# Patient Record
Sex: Female | Born: 1980 | Race: Black or African American | Hispanic: No | Marital: Single | Smoking: Never smoker
Health system: Southern US, Community
[De-identification: ages and names within clinical notes are randomized; demographics above are authoritative.]

## PROBLEM LIST (undated history)

## (undated) DIAGNOSIS — A6000 Herpesviral infection of urogenital system, unspecified: Secondary | ICD-10-CM

## (undated) DIAGNOSIS — D649 Anemia, unspecified: Secondary | ICD-10-CM

## (undated) DIAGNOSIS — IMO0001 Reserved for inherently not codable concepts without codable children: Secondary | ICD-10-CM

## (undated) DIAGNOSIS — K219 Gastro-esophageal reflux disease without esophagitis: Secondary | ICD-10-CM

## (undated) DIAGNOSIS — I10 Essential (primary) hypertension: Secondary | ICD-10-CM

---

## 2005-04-23 ENCOUNTER — Emergency Department (HOSPITAL_COMMUNITY): Admission: EM | Admit: 2005-04-23 | Discharge: 2005-04-24 | Payer: Self-pay | Admitting: Emergency Medicine

## 2005-05-04 ENCOUNTER — Emergency Department (HOSPITAL_COMMUNITY): Admission: EM | Admit: 2005-05-04 | Discharge: 2005-05-05 | Payer: Self-pay | Admitting: Emergency Medicine

## 2005-05-04 ENCOUNTER — Encounter: Admission: RE | Admit: 2005-05-04 | Discharge: 2005-08-02 | Payer: Self-pay | Admitting: Orthopedic Surgery

## 2006-01-24 ENCOUNTER — Emergency Department (HOSPITAL_COMMUNITY): Admission: EM | Admit: 2006-01-24 | Discharge: 2006-01-24 | Payer: Self-pay | Admitting: Family Medicine

## 2009-10-09 ENCOUNTER — Emergency Department (HOSPITAL_COMMUNITY): Admission: EM | Admit: 2009-10-09 | Discharge: 2009-10-09 | Payer: Self-pay | Admitting: Emergency Medicine

## 2010-10-24 LAB — CBC
HCT: 41 % (ref 36.0–46.0)
MCV: 92.7 fL (ref 78.0–100.0)
Platelets: 295 10*3/uL (ref 150–400)
RDW: 13.6 % (ref 11.5–15.5)

## 2011-02-13 ENCOUNTER — Emergency Department (HOSPITAL_COMMUNITY)
Admission: EM | Admit: 2011-02-13 | Discharge: 2011-02-13 | Disposition: A | Discharge status: Home / Self Care | Payer: Medicaid Other | Attending: Emergency Medicine | Admitting: Emergency Medicine

## 2011-02-13 DIAGNOSIS — Z3201 Encounter for pregnancy test, result positive: Secondary | ICD-10-CM | POA: Insufficient documentation

## 2011-02-13 DIAGNOSIS — R109 Unspecified abdominal pain: Secondary | ICD-10-CM | POA: Insufficient documentation

## 2011-02-13 LAB — CBC
MCH: 31.4 pg (ref 26.0–34.0)
MCHC: 35.5 g/dL (ref 30.0–36.0)
Platelets: 322 10*3/uL (ref 150–400)

## 2011-02-13 LAB — WET PREP, GENITAL
Trich, Wet Prep: NONE SEEN
Yeast Wet Prep HPF POC: NONE SEEN

## 2011-02-13 LAB — URINALYSIS, ROUTINE W REFLEX MICROSCOPIC
Bilirubin Urine: NEGATIVE
Glucose, UA: NEGATIVE mg/dL
Ketones, ur: NEGATIVE mg/dL
Leukocytes, UA: NEGATIVE
pH: 8 (ref 5.0–8.0)

## 2011-02-13 LAB — BASIC METABOLIC PANEL
Calcium: 9 mg/dL (ref 8.4–10.5)
GFR calc non Af Amer: >60 mL/min (ref >60)
Sodium: 136 mEq/L (ref 135–145)

## 2013-10-21 DIAGNOSIS — N390 Urinary tract infection, site not specified: Secondary | ICD-10-CM | POA: Insufficient documentation

## 2013-10-21 DIAGNOSIS — Z8619 Personal history of other infectious and parasitic diseases: Secondary | ICD-10-CM | POA: Insufficient documentation

## 2013-10-21 DIAGNOSIS — Z3202 Encounter for pregnancy test, result negative: Secondary | ICD-10-CM | POA: Insufficient documentation

## 2013-10-21 NOTE — ED Notes (Signed)
Low abd pain started today and vaginal irritation started 2 days ago.  Would like to be checked for STDs.

## 2013-10-22 ENCOUNTER — Emergency Department (HOSPITAL_BASED_OUTPATIENT_CLINIC_OR_DEPARTMENT_OTHER)
Admission: EM | Admit: 2013-10-22 | Discharge: 2013-10-22 | Disposition: A | Discharge status: Home / Self Care | Payer: Medicaid Other | Attending: Emergency Medicine | Admitting: Emergency Medicine

## 2013-10-22 ENCOUNTER — Encounter (HOSPITAL_BASED_OUTPATIENT_CLINIC_OR_DEPARTMENT_OTHER): Payer: Self-pay | Admitting: Emergency Medicine

## 2013-10-22 DIAGNOSIS — N390 Urinary tract infection, site not specified: Secondary | ICD-10-CM

## 2013-10-22 HISTORY — DX: Herpesviral infection of urogenital system, unspecified: A60.00

## 2013-10-22 LAB — URINALYSIS, ROUTINE W REFLEX MICROSCOPIC
BILIRUBIN URINE: NEGATIVE
Glucose, UA: NEGATIVE mg/dL
KETONES UR: NEGATIVE mg/dL
Nitrite: NEGATIVE
PH: 7.5 (ref 5.0–8.0)
Protein, ur: 30 mg/dL — AB
SPECIFIC GRAVITY, URINE: 1.025 (ref 1.005–1.030)
UROBILINOGEN UA: 1 mg/dL (ref 0.0–1.0)

## 2013-10-22 LAB — WET PREP, GENITAL
TRICH WET PREP: NONE SEEN
YEAST WET PREP: NONE SEEN

## 2013-10-22 LAB — URINE MICROSCOPIC-ADD ON

## 2013-10-22 LAB — PREGNANCY, URINE: Preg Test, Ur: NEGATIVE

## 2013-10-22 LAB — GC/CHLAMYDIA PROBE AMP
CT Probe RNA: NEGATIVE
GC Probe RNA: NEGATIVE

## 2013-10-22 MED ORDER — NITROFURANTOIN MONOHYD MACRO 100 MG PO CAPS: 100.0000 mg | ORAL_CAPSULE | Freq: Two times a day (BID) | ORAL | Status: DC

## 2013-10-22 MED ORDER — PHENAZOPYRIDINE HCL 200 MG PO TABS: 200.0000 mg | ORAL_TABLET | Freq: Three times a day (TID) | ORAL | Status: DC | PRN

## 2013-10-22 NOTE — ED Provider Notes (Signed)
CSN: [id number]     Arrival date & time 10/21/13  2354 History   This chart was scribed for Kennis Buell Smitty CordsK Ormond Lazo-Rasch, MD by Ladona Ridgelaylor Day, ED scribe. This patient was seen in room MH05/MH05 and the patient's care was started at 2354.  Chief Complaint  Patient presents with  . Abdominal Pain   Patient is a 33 y.o. female presenting with vaginal itching. The history is provided by the patient. No language interpreter was used.  Vaginal Itching This is a new problem. The current episode started 2 days ago. The problem occurs constantly. The problem has not changed since onset.Pertinent negatives include no chest pain, no abdominal pain, no headaches and no shortness of breath. Nothing aggravates the symptoms. Nothing relieves the symptoms. She has tried nothing for the symptoms. The treatment provided no relief.   HPI Comments: Latasha Mills is a 33 y.o. female who presents to the Emergency Department to be checked for STD because she states has had x1 day of vaginal irritation. She reports tried an at home treatment for this problem w/no relief.   LNMP week ago Past Medical History  Diagnosis Date  . Herpes genitalia    Past Surgical History  Procedure Laterality Date  . Cesarean section     No family history on file. History  Substance Use Topics  . Smoking status: Never Smoker   . Smokeless tobacco: Not on file  . Alcohol Use: No   OB History   Grav Para Term Preterm Abortions TAB SAB Ect Mult Living                 Review of Systems  Respiratory: Negative for shortness of breath.   Cardiovascular: Negative for chest pain.  Gastrointestinal: Negative for abdominal pain.  Genitourinary: Negative for genital sores.  Musculoskeletal: Negative for back pain.  Skin: Negative for rash.  Neurological: Negative for headaches.  All other systems reviewed and are negative.    Allergies  Other  Home Medications  No current outpatient prescriptions on file.  Triage Vitals: BP  113/59  Pulse 86  Temp(Src) 98.2 F (36.8 C) (Oral)  Resp 16  Ht 5\' 1"  (1.549 m)  Wt 157 lb (71.215 kg)  BMI 29.68 kg/m2  SpO2 97%  LMP 10/12/2013 Physical Exam  Nursing note and vitals reviewed. Constitutional: She is oriented to person, place, and time. She appears well-developed and well-nourished. No distress.  HENT:  Head: Normocephalic and atraumatic.  Mouth/Throat: Oropharynx is clear and moist.  Eyes: Conjunctivae are normal. Right eye exhibits no discharge. Left eye exhibits no discharge.  Neck: Normal range of motion.  Cardiovascular: Normal rate, regular rhythm and normal heart sounds.   Pulmonary/Chest: Effort normal and breath sounds normal. No respiratory distress. She has no wheezes. She has no rales.  Abdominal: Soft. Bowel sounds are normal. There is no tenderness. There is no rebound and no guarding.  Genitourinary: No vaginal discharge found.  Chaperone present no lesions  Musculoskeletal: Normal range of motion. She exhibits no edema.  Neurological: She is alert and oriented to person, place, and time.  Skin: Skin is warm and dry.  Psychiatric: She has a normal mood and affect. Thought content normal.    ED Course  Procedures (including critical care time) DIAGNOSTIC STUDIES: Oxygen Saturation is 97% on room air, normal by my interpretation.    COORDINATION OF CARE:   Labs Review Labs Reviewed  PREGNANCY, URINE  URINALYSIS, ROUTINE W REFLEX MICROSCOPIC   Imaging Review No results found.  EKG Interpretation None      MDM   Final diagnoses:  None    Use only ivory soap and no chemicals in the bath.  Will treat for UTI   I personally performed the services described in this documentation, which was scribed in my presence. The recorded information has been reviewed and is accurate.      Jasmine Awe, MD 10/22/13 [phone]

## 2014-03-02 ENCOUNTER — Encounter (HOSPITAL_BASED_OUTPATIENT_CLINIC_OR_DEPARTMENT_OTHER): Payer: Self-pay | Admitting: Emergency Medicine

## 2014-03-02 ENCOUNTER — Emergency Department (HOSPITAL_BASED_OUTPATIENT_CLINIC_OR_DEPARTMENT_OTHER)
Admission: EM | Admit: 2014-03-02 | Discharge: 2014-03-02 | Disposition: A | Discharge status: Home / Self Care | Payer: Medicaid Other | Attending: Emergency Medicine | Admitting: Emergency Medicine

## 2014-03-02 DIAGNOSIS — Z8719 Personal history of other diseases of the digestive system: Secondary | ICD-10-CM | POA: Insufficient documentation

## 2014-03-02 DIAGNOSIS — Z79899 Other long term (current) drug therapy: Secondary | ICD-10-CM | POA: Diagnosis not present

## 2014-03-02 DIAGNOSIS — Z8619 Personal history of other infectious and parasitic diseases: Secondary | ICD-10-CM | POA: Diagnosis not present

## 2014-03-02 DIAGNOSIS — Z113 Encounter for screening for infections with a predominantly sexual mode of transmission: Secondary | ICD-10-CM | POA: Diagnosis present

## 2014-03-02 DIAGNOSIS — Z202 Contact with and (suspected) exposure to infections with a predominantly sexual mode of transmission: Secondary | ICD-10-CM

## 2014-03-02 DIAGNOSIS — Z862 Personal history of diseases of the blood and blood-forming organs and certain disorders involving the immune mechanism: Secondary | ICD-10-CM | POA: Diagnosis not present

## 2014-03-02 DIAGNOSIS — Z3202 Encounter for pregnancy test, result negative: Secondary | ICD-10-CM | POA: Diagnosis not present

## 2014-03-02 HISTORY — DX: Anemia, unspecified: D64.9

## 2014-03-02 HISTORY — DX: Reserved for inherently not codable concepts without codable children: IMO0001

## 2014-03-02 HISTORY — DX: Gastro-esophageal reflux disease without esophagitis: K21.9

## 2014-03-02 LAB — URINALYSIS, ROUTINE W REFLEX MICROSCOPIC
Bilirubin Urine: NEGATIVE
GLUCOSE, UA: NEGATIVE mg/dL
Ketones, ur: NEGATIVE mg/dL
LEUKOCYTES UA: NEGATIVE
Nitrite: NEGATIVE
PH: 6 (ref 5.0–8.0)
Protein, ur: NEGATIVE mg/dL
SPECIFIC GRAVITY, URINE: 1.023 (ref 1.005–1.030)
Urobilinogen, UA: 2 mg/dL — ABNORMAL HIGH (ref 0.0–1.0)

## 2014-03-02 LAB — WET PREP, GENITAL
TRICH WET PREP: NONE SEEN
WBC WET PREP: NONE SEEN
Yeast Wet Prep HPF POC: NONE SEEN

## 2014-03-02 LAB — PREGNANCY, URINE: Preg Test, Ur: NEGATIVE

## 2014-03-02 LAB — URINE MICROSCOPIC-ADD ON

## 2014-03-02 MED ORDER — AZITHROMYCIN 250 MG PO TABS
1000.0000 mg | ORAL_TABLET | Freq: Once | ORAL | Status: AC
  Administered 2014-03-02: 1000 mg via ORAL
  Filled 2014-03-02: qty 4

## 2014-03-02 NOTE — ED Notes (Signed)
MD with pt  

## 2014-03-02 NOTE — ED Notes (Signed)
C/o vaginal pain that started today. Denies any increase in vaginal d/c. Denies any fevers. States her partner was treated for Chlamydia and request an STD check.  Denies any urinary symptoms.

## 2014-03-02 NOTE — ED Provider Notes (Signed)
CSN: [id number]     Arrival date & time 03/02/14  0421 History   First MD Initiated Contact with Patient 03/02/14 [phone]     Chief Complaint  Patient presents with  . STD check      (Consider location/radiation/quality/duration/timing/severity/associated sxs/prior Treatment) Patient is a 33 y.o. female presenting with STD exposure. The history is provided by the patient.  Exposure to STD This is a new problem. The current episode started 12 to 24 hours ago. The problem occurs constantly. The problem has not changed since onset.Pertinent negatives include no chest pain, no abdominal pain, no headaches and no shortness of breath. Nothing aggravates the symptoms. Nothing relieves the symptoms. She has tried nothing for the symptoms. The treatment provided no relief.  Partner was treated for chlamydia according to a friend and not the partner himself and she had sex with him following treatment with a condom and wants to be checked  Past Medical History  Diagnosis Date  . Herpes genitalia   . Reflux   . Anemia    Past Surgical History  Procedure Laterality Date  . Cesarean section     History reviewed. No pertinent family history. History  Substance Use Topics  . Smoking status: Never Smoker   . Smokeless tobacco: Not on file  . Alcohol Use: No   OB History   Grav Para Term Preterm Abortions TAB SAB Ect Mult Living                 Review of Systems  Respiratory: Negative for shortness of breath.   Cardiovascular: Negative for chest pain.  Gastrointestinal: Negative for abdominal pain.  Neurological: Negative for headaches.  All other systems reviewed and are negative.     Allergies  Other  Home Medications   Prior to Admission medications   Medication Sig Start Date End Date Taking? Authorizing Provider  nitrofurantoin, macrocrystal-monohydrate, (MACROBID) 100 MG capsule Take 1 capsule (100 mg total) by mouth 2 (two) times daily. X 7 days 10/22/13   Cheresa Siers K  Wyona Neils-Rasch, MD  phenazopyridine (PYRIDIUM) 200 MG tablet Take 1 tablet (200 mg total) by mouth 3 (three) times daily as needed for pain. 10/22/13   Lalania Haseman K Ainslie Mazurek-Rasch, MD   BP 132/70  Pulse 79  Temp(Src) 98.7 F (37.1 C) (Oral)  Resp 18  SpO2 100%  LMP 01/30/2014 Physical Exam  Constitutional: She is oriented to person, place, and time. She appears well-developed and well-nourished. No distress.  HENT:  Head: Normocephalic and atraumatic.  Mouth/Throat: Oropharynx is clear and moist.  Eyes: Conjunctivae are normal. Pupils are equal, round, and reactive to light.  Neck: Normal range of motion. Neck supple.  Cardiovascular: Normal rate, regular rhythm and intact distal pulses.   Pulmonary/Chest: Effort normal and breath sounds normal. She has no wheezes. She has no rales.  Abdominal: Soft. Bowel sounds are normal. There is no tenderness. There is no rebound and no guarding.  Genitourinary: No vaginal discharge found.  No cmt no adnexal tenderness.  No discharge Chaperone present  Musculoskeletal: Normal range of motion.  Neurological: She is oriented to person, place, and time.  Skin: Skin is warm and dry.  Psychiatric: She has a normal mood and affect.    ED Course  Procedures (including critical care time) Labs Review Labs Reviewed  WET PREP, GENITAL - Abnormal; Notable for the following:    Clue Cells Wet Prep HPF POC FEW (*)    All other components within normal limits  URINALYSIS, ROUTINE W  REFLEX MICROSCOPIC - Abnormal; Notable for the following:    Hgb urine dipstick TRACE (*)    Urobilinogen, UA 2.0 (*)    All other components within normal limits  URINE MICROSCOPIC-ADD ON - Abnormal; Notable for the following:    Squamous Epithelial / LPF FEW (*)    Bacteria, UA FEW (*)    All other components within normal limits  GC/CHLAMYDIA PROBE AMP  PREGNANCY, URINE    Imaging Review No results found.   EKG Interpretation None      MDM   Final diagnoses:   None    Will treat for chlamydia.  [facility].  No sexual activity of any kind until 7 days after all partners treated   Trenda Corliss Smitty Cords, MD 03/02/14 [id number]

## 2014-03-03 LAB — GC/CHLAMYDIA PROBE AMP
CT PROBE, AMP APTIMA: NEGATIVE
GC Probe RNA: NEGATIVE

## 2015-03-14 ENCOUNTER — Encounter (HOSPITAL_BASED_OUTPATIENT_CLINIC_OR_DEPARTMENT_OTHER): Payer: Self-pay | Admitting: *Deleted

## 2015-03-14 ENCOUNTER — Emergency Department (HOSPITAL_BASED_OUTPATIENT_CLINIC_OR_DEPARTMENT_OTHER)
Admission: EM | Admit: 2015-03-14 | Discharge: 2015-03-14 | Disposition: A | Discharge status: Home / Self Care | Payer: Medicaid Other | Attending: Emergency Medicine | Admitting: Emergency Medicine

## 2015-03-14 DIAGNOSIS — Z3202 Encounter for pregnancy test, result negative: Secondary | ICD-10-CM | POA: Diagnosis not present

## 2015-03-14 DIAGNOSIS — Z862 Personal history of diseases of the blood and blood-forming organs and certain disorders involving the immune mechanism: Secondary | ICD-10-CM | POA: Insufficient documentation

## 2015-03-14 DIAGNOSIS — N898 Other specified noninflammatory disorders of vagina: Secondary | ICD-10-CM | POA: Diagnosis present

## 2015-03-14 DIAGNOSIS — Z8619 Personal history of other infectious and parasitic diseases: Secondary | ICD-10-CM | POA: Diagnosis not present

## 2015-03-14 DIAGNOSIS — N760 Acute vaginitis: Secondary | ICD-10-CM | POA: Diagnosis not present

## 2015-03-14 DIAGNOSIS — Z8719 Personal history of other diseases of the digestive system: Secondary | ICD-10-CM | POA: Diagnosis not present

## 2015-03-14 DIAGNOSIS — B9689 Other specified bacterial agents as the cause of diseases classified elsewhere: Secondary | ICD-10-CM

## 2015-03-14 LAB — URINALYSIS, ROUTINE W REFLEX MICROSCOPIC
Bilirubin Urine: NEGATIVE
Glucose, UA: NEGATIVE mg/dL
HGB URINE DIPSTICK: NEGATIVE
Ketones, ur: 15 mg/dL — AB
Leukocytes, UA: NEGATIVE
Nitrite: NEGATIVE
Protein, ur: 100 mg/dL — AB
SPECIFIC GRAVITY, URINE: 1.031 — AB (ref 1.005–1.030)
UROBILINOGEN UA: 1 mg/dL (ref 0.0–1.0)
pH: 6.5 (ref 5.0–8.0)

## 2015-03-14 LAB — URINE MICROSCOPIC-ADD ON

## 2015-03-14 LAB — WET PREP, GENITAL
Trich, Wet Prep: NONE SEEN
Yeast Wet Prep HPF POC: NONE SEEN

## 2015-03-14 LAB — PREGNANCY, URINE: PREG TEST UR: NEGATIVE

## 2015-03-14 MED ORDER — LIDOCAINE HCL (PF) 1 % IJ SOLN
INTRAMUSCULAR | Status: AC
  Administered 2015-03-14: 5 mL
  Filled 2015-03-14: qty 5

## 2015-03-14 MED ORDER — METRONIDAZOLE 500 MG PO TABS: 500.0000 mg | ORAL_TABLET | Freq: Two times a day (BID) | ORAL | Status: DC

## 2015-03-14 MED ORDER — AZITHROMYCIN 250 MG PO TABS
1000.0000 mg | ORAL_TABLET | Freq: Once | ORAL | Status: AC
  Administered 2015-03-14: 1000 mg via ORAL
  Filled 2015-03-14: qty 4

## 2015-03-14 MED ORDER — CEFTRIAXONE SODIUM 250 MG IJ SOLR
250.0000 mg | Freq: Once | INTRAMUSCULAR | Status: AC
  Administered 2015-03-14: 250 mg via INTRAMUSCULAR
  Filled 2015-03-14: qty 250

## 2015-03-14 NOTE — ED Provider Notes (Signed)
CSN: [id number]     Arrival date & time 03/14/15  1824 History   First MD Initiated Contact with Patient 03/14/15 1856     Chief Complaint  Patient presents with  . Vaginal Discharge     (Consider location/radiation/quality/duration/timing/severity/associated sxs/prior Treatment) HPI Comments: 34 year old female complaining of vaginal discharge 6 days, yellow in appearance. No vaginal pain, itching, abdominal pain, nausea, vomiting, fever, chills, urinary symptoms. No new sexual partners. Last had intercourse with her child's father one month ago and does not use protection. He is sleeping with another female and she is concerned about STDs. No aggravating or alleviating factors.  Patient is a 34 y.o. female presenting with vaginal discharge. The history is provided by the patient.  Vaginal Discharge   Past Medical History  Diagnosis Date  . Herpes genitalia   . Reflux   . Anemia    Past Surgical History  Procedure Laterality Date  . Cesarean section     No family history on file. Social History  Substance Use Topics  . Smoking status: Never Smoker   . Smokeless tobacco: Never Used  . Alcohol Use: Yes     Comment: occasional   OB History    No data available     Review of Systems  Genitourinary: Positive for vaginal discharge.  All other systems reviewed and are negative.     Allergies  Other  Home Medications   Prior to Admission medications   Medication Sig Start Date End Date Taking? Authorizing Provider  metroNIDAZOLE (FLAGYL) 500 MG tablet Take 1 tablet (500 mg total) by mouth 2 (two) times daily. One po bid x 7 days 03/14/15   Kathrynn Speed, PA-C  nitrofurantoin, macrocrystal-monohydrate, (MACROBID) 100 MG capsule Take 1 capsule (100 mg total) by mouth 2 (two) times daily. X 7 days 10/22/13   April Palumbo, MD  phenazopyridine (PYRIDIUM) 200 MG tablet Take 1 tablet (200 mg total) by mouth 3 (three) times daily as needed for pain. 10/22/13   April Palumbo, MD    BP 139/79 mmHg  Pulse 85  Temp(Src) 99.1 F (37.3 C) (Oral)  Resp 18  Ht  (1.575 m)  Wt 175 lb (79.379 kg)  BMI 32.00 kg/m2  SpO2 100%  LMP 02/25/2015 Physical Exam  Constitutional: She is oriented to person, place, and time. She appears well-developed and well-nourished. No distress.  HENT:  Head: Normocephalic and atraumatic.  Mouth/Throat: Oropharynx is clear and moist.  Eyes: Conjunctivae and EOM are normal.  Neck: Normal range of motion. Neck supple.  Cardiovascular: Normal rate, regular rhythm and normal heart sounds.   Pulmonary/Chest: Effort normal and breath sounds normal. No respiratory distress.  Abdominal: Soft. Bowel sounds are normal. She exhibits no distension. There is no tenderness.  Genitourinary: Uterus normal. Cervix exhibits no motion tenderness and no discharge. Right adnexum displays no tenderness. Left adnexum displays no tenderness. No erythema, tenderness or bleeding in the vagina. Vaginal discharge found.  Musculoskeletal: Normal range of motion. She exhibits no edema.  Neurological: She is alert and oriented to person, place, and time. No sensory deficit.  Skin: Skin is warm and dry.  Psychiatric: She has a normal mood and affect. Her behavior is normal.  Nursing note and vitals reviewed.   ED Course  Procedures (including critical care time) Labs Review Labs Reviewed  WET PREP, GENITAL - Abnormal; Notable for the following:    Clue Cells Wet Prep HPF POC MODERATE (*)    WBC, Wet Prep HPF POC MODERATE (*)  All other components within normal limits  URINALYSIS, ROUTINE W REFLEX MICROSCOPIC (NOT AT [facility]) - Abnormal; Notable for the following:    Specific Gravity, Urine 1.031 (*)    Ketones, ur 15 (*)    Protein, ur 100 (*)    All other components within normal limits  URINE MICROSCOPIC-ADD ON - Abnormal; Notable for the following:    Squamous Epithelial / LPF FEW (*)    Bacteria, UA MANY (*)    All other components within normal limits   PREGNANCY, URINE  GC/CHLAMYDIA PROBE AMP ([place]) NOT AT [facility]    Imaging Review No results found. I, Celene Skeen, personally reviewed and evaluated these images and lab results as part of my medical decision-making.   EKG Interpretation None      MDM   Final diagnoses:  BV (bacterial vaginosis)   Nontoxic appearing, NAD. AF VSS. Abdomen soft and nontender. No CMT or adnexal tenderness concerning for PID, ovarian torsion. No cervical discharge. Patient concerned of STDs and requesting treatment, Rocephin and azithromycin given. Will treat BV with Flagyl. Infection care/precautions discussed. Safe sexual practices discussed. Stable for discharge. Follow-up with PCP. Return precautions given. Patient states understanding of treatment care plan and is agreeable.  Kathrynn Speed, PA-C 03/14/15 2006  Blake Divine, MD 03/16/15 [phone]

## 2015-03-14 NOTE — ED Notes (Signed)
Pt reports vaginal irritation and discharge since Sunday- last sex 1 month ago, no condom used

## 2015-03-14 NOTE — Discharge Instructions (Signed)
Take Flagyl twice daily for 1 week. No sexual intercourse for 10 days after completing antibiotics. You were treated today for both gonorrhea and chlamydia. If these tests result positive, you will be contacted and are then obligated to inform your partner for treatment.  Bacterial Vaginosis Bacterial vaginosis is a vaginal infection that occurs when the normal balance of bacteria in the vagina is disrupted. It results from an overgrowth of certain bacteria. This is the most common vaginal infection in women of childbearing age. Treatment is important to prevent complications, especially in pregnant women, as it can cause a premature delivery. CAUSES  Bacterial vaginosis is caused by an increase in harmful bacteria that are normally present in smaller amounts in the vagina. Several different kinds of bacteria can cause bacterial vaginosis. However, the reason that the condition develops is not fully understood. RISK FACTORS Certain activities or behaviors can put you at an increased risk of developing bacterial vaginosis, including:  Having a new sex partner or multiple sex partners.  Douching.  Using an intrauterine device (IUD) for contraception. Women do not get bacterial vaginosis from toilet seats, bedding, swimming pools, or contact with objects around them. SIGNS AND SYMPTOMS  Some women with bacterial vaginosis have no signs or symptoms. Common symptoms include:  Grey vaginal discharge.  A fishlike odor with discharge, especially after sexual intercourse.  Itching or burning of the vagina and vulva.  Burning or pain with urination. DIAGNOSIS  Your health care provider will take a medical history and examine the vagina for signs of bacterial vaginosis. A sample of vaginal fluid may be taken. Your health care provider will look at this sample under a microscope to check for bacteria and abnormal cells. A vaginal pH test may also be done.  TREATMENT  Bacterial vaginosis may be  treated with antibiotic medicines. These may be given in the form of a pill or a vaginal cream. A second round of antibiotics may be prescribed if the condition comes back after treatment.  HOME CARE INSTRUCTIONS   Only take over-the-counter or prescription medicines as directed by your health care provider.  If antibiotic medicine was prescribed, take it as directed. Make sure you finish it even if you start to feel better.  Do not have sex until treatment is completed.  Tell all sexual partners that you have a vaginal infection. They should see their health care provider and be treated if they have problems, such as a mild rash or itching.  Practice safe sex by using condoms and only having one sex partner. SEEK MEDICAL CARE IF:   Your symptoms are not improving after 3 days of treatment.  You have increased discharge or pain.  You have a fever. MAKE SURE YOU:   Understand these instructions.  Will watch your condition.  Will get help right away if you are not doing well or get worse. FOR MORE INFORMATION  Centers for Disease Control and Prevention, Division of STD Prevention: [employer] American Sexual Health Association (ASHA): www.ashastd.org  Document Released: 07/18/2005 Document Revised: 05/08/2013 Document Reviewed: 02/27/2013 [facility] Patient Information 2015 [place], [place]. This information is not intended to replace advice given to you by your health care provider. Make sure you discuss any questions you have with your health care provider.

## 2015-03-16 LAB — GC/CHLAMYDIA PROBE AMP (~~LOC~~) NOT AT ARMC
CHLAMYDIA, DNA PROBE: NEGATIVE
NEISSERIA GONORRHEA: NEGATIVE

## 2015-05-31 ENCOUNTER — Emergency Department (HOSPITAL_BASED_OUTPATIENT_CLINIC_OR_DEPARTMENT_OTHER): Payer: Medicaid Other

## 2015-05-31 ENCOUNTER — Encounter (HOSPITAL_BASED_OUTPATIENT_CLINIC_OR_DEPARTMENT_OTHER): Payer: Self-pay | Admitting: *Deleted

## 2015-05-31 ENCOUNTER — Emergency Department (HOSPITAL_BASED_OUTPATIENT_CLINIC_OR_DEPARTMENT_OTHER)
Admission: EM | Admit: 2015-05-31 | Discharge: 2015-05-31 | Disposition: A | Discharge status: Home / Self Care | Payer: Medicaid Other | Attending: Emergency Medicine | Admitting: Emergency Medicine

## 2015-05-31 DIAGNOSIS — Z8619 Personal history of other infectious and parasitic diseases: Secondary | ICD-10-CM | POA: Diagnosis not present

## 2015-05-31 DIAGNOSIS — R0989 Other specified symptoms and signs involving the circulatory and respiratory systems: Secondary | ICD-10-CM | POA: Insufficient documentation

## 2015-05-31 DIAGNOSIS — Z8719 Personal history of other diseases of the digestive system: Secondary | ICD-10-CM | POA: Diagnosis not present

## 2015-05-31 DIAGNOSIS — Z862 Personal history of diseases of the blood and blood-forming organs and certain disorders involving the immune mechanism: Secondary | ICD-10-CM | POA: Insufficient documentation

## 2015-05-31 DIAGNOSIS — K228 Other specified diseases of esophagus: Secondary | ICD-10-CM

## 2015-05-31 DIAGNOSIS — R198 Other specified symptoms and signs involving the digestive system and abdomen: Secondary | ICD-10-CM

## 2015-05-31 MED ORDER — GI COCKTAIL ~~LOC~~
30.0000 mL | Freq: Once | ORAL | Status: AC
  Administered 2015-05-31: 30 mL via ORAL
  Filled 2015-05-31: qty 30

## 2015-05-31 MED ORDER — IOHEXOL 300 MG/ML  SOLN
75.0000 mL | Freq: Once | INTRAMUSCULAR | Status: AC | PRN
Start: 2015-05-31 — End: 2015-05-31
  Administered 2015-05-31: 75 mL via INTRAVENOUS

## 2015-05-31 NOTE — ED Notes (Signed)
Swallowed clear plastic jewelry piece early Saturday morning which is irritating throat, feels stuck, reports intermitant gag and cough, vomited x1. No obvious dyspnea. Able to keep down and tolerate liquids.

## 2015-05-31 NOTE — ED Notes (Signed)
Back from xray, no changes, calm, NAD.

## 2015-05-31 NOTE — ED Notes (Signed)
Dr. Judd Lienelo into see pt at time of d/c, results and plan explained.

## 2015-05-31 NOTE — ED Provider Notes (Signed)
CSN: [id number]     Arrival date & time 05/31/15  0248 History   First MD Initiated Contact with Patient 05/31/15 0304     Chief Complaint  Patient presents with  . Foreign Body     (Consider location/radiation/quality/duration/timing/severity/associated sxs/prior Treatment) HPI Comments: Patient is a 34 year old female who presents with complaints of throat pain. She states that she had a piece of clear plastic jewelry in her mouth yesterday morning but she inadvertently swallowed. She states that her throat is irritated and she feels as though something is stuck. She denies any difficulty breathing. She reports occasional gagging.  Patient is a 34 y.o. female presenting with foreign body. The history is provided by the patient.  Foreign Body Intake: Throat. Suspected object: Plastic. Pain quality:  Sharp Pain severity:  Moderate Duration:  24 hours Timing:  Constant Progression:  Worsening Chronicity:  New Worsened by:  Nothing tried Ineffective treatments:  None tried   Past Medical History  Diagnosis Date  . Herpes genitalia   . Reflux   . Anemia    Past Surgical History  Procedure Laterality Date  . Cesarean section     History reviewed. No pertinent family history. Social History  Substance Use Topics  . Smoking status: Never Smoker   . Smokeless tobacco: Never Used  . Alcohol Use: Yes     Comment: occasional   OB History    No data available     Review of Systems  All other systems reviewed and are negative.     Allergies  Other  Home Medications   Prior to Admission medications   Medication Sig Start Date End Date Taking? Authorizing Provider  metroNIDAZOLE (FLAGYL) 500 MG tablet Take 1 tablet (500 mg total) by mouth 2 (two) times daily. One po bid x 7 days 03/14/15   Kathrynn Speed, PA-C  nitrofurantoin, macrocrystal-monohydrate, (MACROBID) 100 MG capsule Take 1 capsule (100 mg total) by mouth 2 (two) times daily. X 7 days 10/22/13   April Palumbo,  MD  phenazopyridine (PYRIDIUM) 200 MG tablet Take 1 tablet (200 mg total) by mouth 3 (three) times daily as needed for pain. 10/22/13   April Palumbo, MD   BP 154/100 mmHg  Pulse 73  Temp(Src) 98.2 F (36.8 C) (Oral)  Resp 16  Ht  (1.575 m)  Wt 186 lb (84.369 kg)  BMI 34.01 kg/m2  SpO2 99%  LMP 05/21/2015 (Approximate) Physical Exam  Constitutional: She is oriented to person, place, and time. She appears well-developed and well-nourished. No distress.  HENT:  Head: Normocephalic and atraumatic.  Mouth/Throat: Oropharynx is clear and moist.  Neck: Normal range of motion. Neck supple.  Cardiovascular: Normal rate, regular rhythm and normal heart sounds.   No murmur heard. Pulmonary/Chest: Effort normal and breath sounds normal. No respiratory distress. She has no wheezes.  Abdominal: Soft. Bowel sounds are normal.  Neurological: She is alert and oriented to person, place, and time.  Skin: Skin is warm and dry. She is not diaphoretic.  Nursing note and vitals reviewed.   ED Course  Procedures (including critical care time) Labs Review Labs Reviewed - No data to display  Imaging Review No results found. I have personally reviewed and evaluated these images and lab results as part of my medical decision-making.   EKG Interpretation None      MDM   Final diagnoses:  None    There are no radiopaque foreign bodies on x-ray or CT scan. I suspect she has scratched  her throat and this is the discomfort that she is feeling. I will advise her to give this time. If she is not improving in the next 1-2 days, she is to go to [facility] for further evaluation and possible endoscopy.    Geoffery Lyonsouglas Shyanne Mcclary, MD 05/31/15 [phone]

## 2015-05-31 NOTE — Discharge Instructions (Signed)
Go to [facility] if you're not improving in the next 1-2 days.   Esophagogastroduodenoscopy Esophagogastroduodenoscopy (EGD) is a procedure that is used to examine the lining of the esophagus, stomach, and first part of the small intestine (duodenum). A long, flexible, lighted tube with a camera attached (endoscope) is inserted down the throat to view these organs. This procedure is done to detect problems or abnormalities, such as inflammation, bleeding, ulcers, or growths, in order to treat them. The procedure lasts 5-20 minutes. It is usually an outpatient procedure, but it may need to be performed in a hospital in emergency cases. LET [facility] CARE PROVIDER KNOW ABOUT:  Any allergies you have.  All medicines you are taking, including vitamins, herbs, eye drops, creams, and over-the-counter medicines.  Previous problems you or members of your family have had with the use of anesthetics.  Any blood disorders you have.  Previous surgeries you have had.  Medical conditions you have. RISKS AND COMPLICATIONS Generally, this is a safe procedure. However, problems can occur and include:  Infection.  Bleeding.  Tearing (perforation) of the esophagus, stomach, or duodenum.  Difficulty breathing or not being able to breathe.  Excessive sweating.  Spasms of the larynx.  Slowed heartbeat.  Low blood pressure. BEFORE THE PROCEDURE  Do not eat or drink anything after midnight on the night before the procedure or as directed by your health care provider.  Do not take your regular medicines before the procedure if your health care provider asks you not to. Ask your health care provider about changing or stopping those medicines.  If you wear dentures, be prepared to remove them before the procedure.  Arrange for someone to drive you home after the procedure. PROCEDURE  A numbing medicine (local anesthetic) may be sprayed in your throat for comfort and to stop you from  gagging or coughing.  You will have an IV tube inserted in a vein in your hand or arm. You will receive medicines and fluids through this tube.  You will be given a medicine to relax you (sedative).  A pain reliever will be given through the IV tube.  A mouth guard may be placed in your mouth to protect your teeth and to keep you from biting on the endoscope.  You will be asked to lie on your left side.  The endoscope will be inserted down your throat and into your esophagus, stomach, and duodenum.  Air will be put through the endoscope to allow your health care provider to clearly view the lining of your esophagus.  The lining of your esophagus, stomach, and duodenum will be examined. During the exam, your health care provider may:  Remove tissue to be examined under a microscope (biopsy) for inflammation, infection, or other medical problems.  Remove growths.  Remove objects (foreign bodies) that are stuck.  Treat any bleeding with medicines or other devices that stop tissues from bleeding (hot cautery, clipping devices).  Widen (dilate) or stretch narrowed areas of your esophagus and stomach.  The endoscope will be withdrawn. AFTER THE PROCEDURE  You will be taken to a recovery area for observation. Your blood pressure, heart rate, breathing rate, and blood oxygen level will be monitored often until the medicines you were given have worn off.  Do not eat or drink anything until the numbing medicine has worn off and your gag reflex has returned. You may choke.  Your health care provider should be able to discuss his or her findings with  you. It will take longer to discuss the test results if any biopsies were taken.   This information is not intended to replace advice given to you by your health care provider. Make sure you discuss any questions you have with your health care provider.   Document Released: 11/18/2004 Document Revised: 08/08/2014 Document Reviewed:  06/20/2012 Elsevier Interactive Patient Education [employer]2016 Elsevier Inc.

## 2015-05-31 NOTE — ED Notes (Signed)
Pt to xray, alert, NAD, calm, interactive, no active dyspnea, coughing or gagging, steady gait.

## 2015-05-31 NOTE — ED Notes (Signed)
Dr. Delo into room.  

## 2015-11-01 IMAGING — CT CT NECK W/ CM
4 of 5 series · 15 of 33 positions shown, 18 images · IV contrast (omnipaque)
Comparison: None.

CLINICAL DATA: Anterior midline neck pain, patient swallowed clear
plastic jewelry yesterday morning, with intermittent gagging and
coughing.

EXAM:
CT NECK WITH CONTRAST
TECHNIQUE: Multidetector CT imaging of the neck was performed using the
standard protocol following the bolus administration of intravenous
contrast.
CONTRAST:  75mL OMNIPAQUE IOHEXOL 300 MG/ML  SOLN

[Series 4: neck 2.0 b31s · axial · 0.43mm/px · z∈[-220,-174]mm · 2 of 114 slices shown]
[im 23/114  bone]
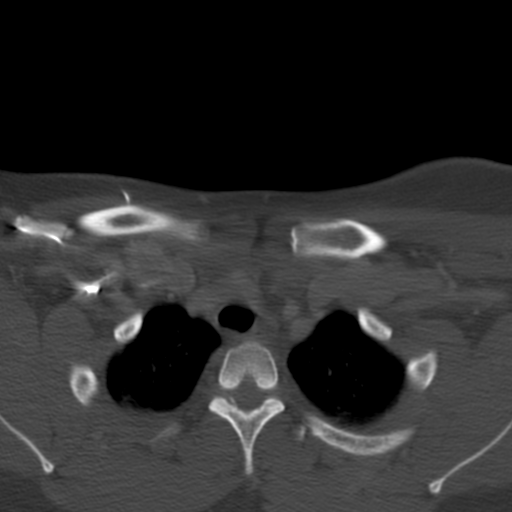
[im 46/114  bone]
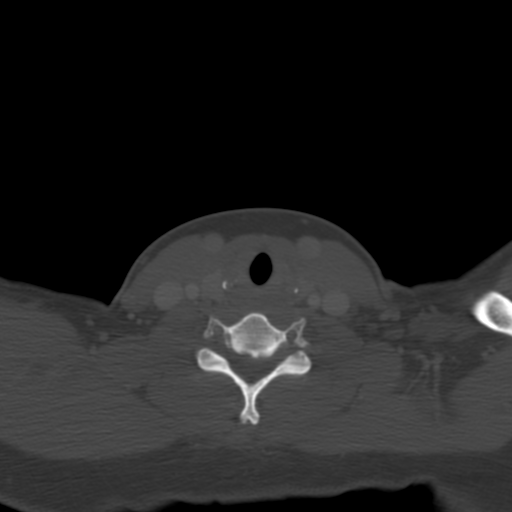

[Series 9: neck 2.0 coronal · coronal · 0.46mm/px · 3 of 88 slices shown]
[im 18/88  bone]
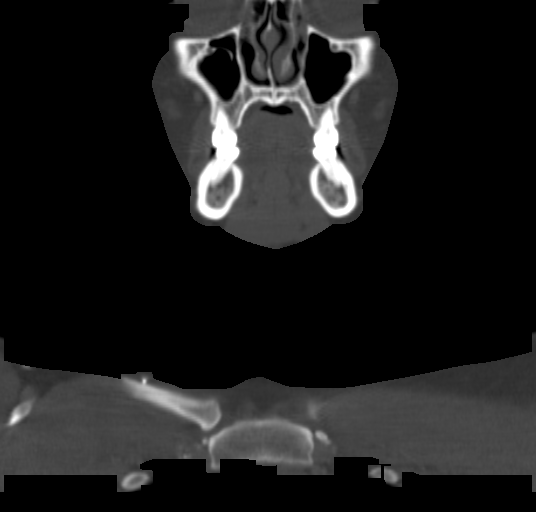
[im 35/88  bone]
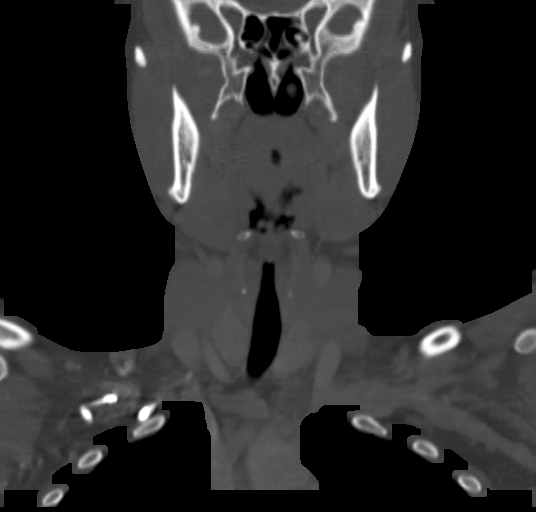
[im 53/88  bone]
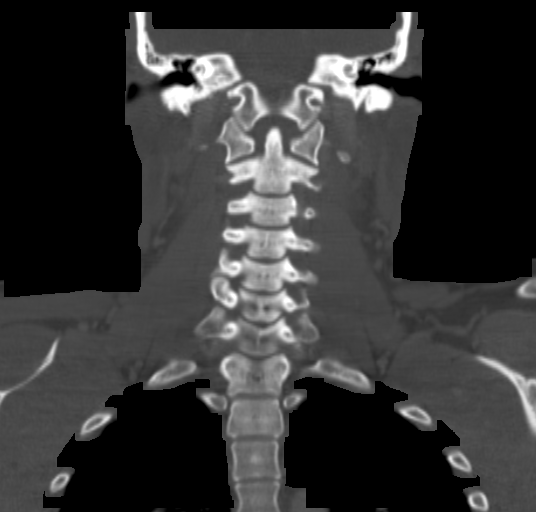

[Series 10: neck 2.0 sagittal · sagittal · 0.45mm/px · 5 of 82 slices shown, 6 images]
[im 28/82  bone]
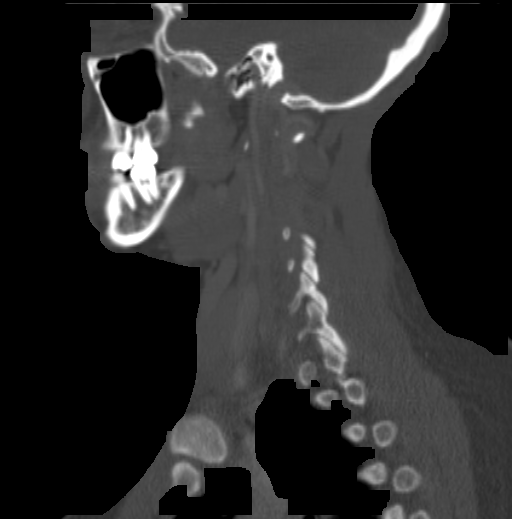
[im 34/82  bone]
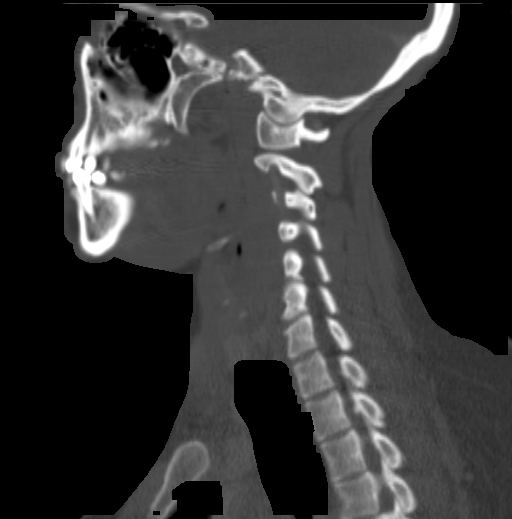
[im 41/82  soft-tissue]
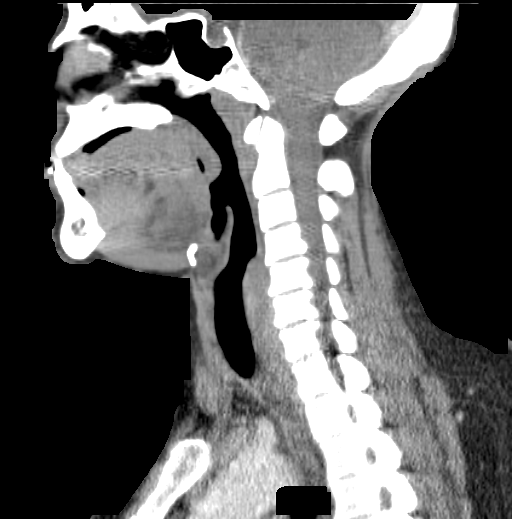
[im 41/82  bone]
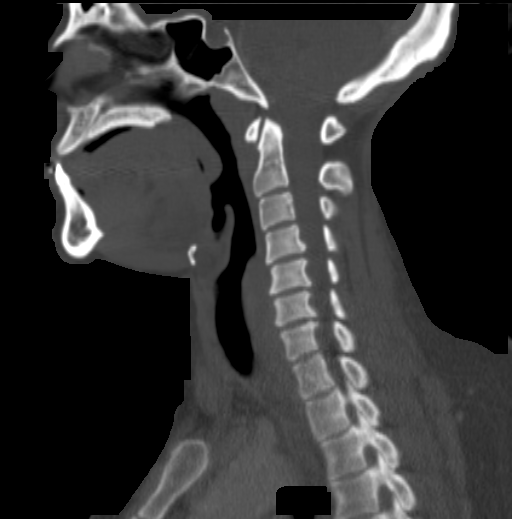
[im 48/82  bone]
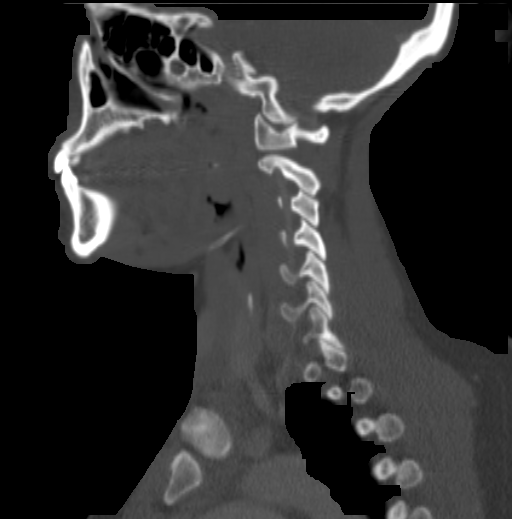
[im 55/82  bone]
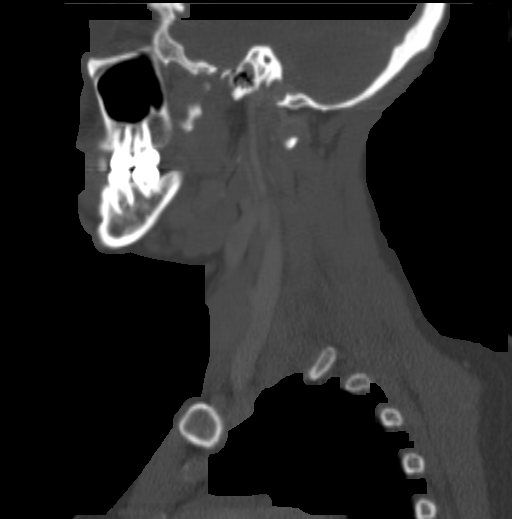

[Series 11: neck 2.0 orth axial to hyoid · axial · 0.33mm/px · z∈[-247,-92]mm · 5 of 118 slices shown, 7 images]
[im 20/118  soft-tissue]
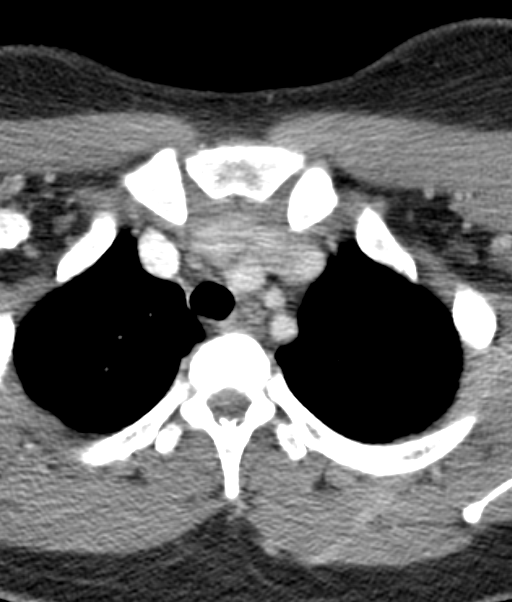
[im 20/118  bone]
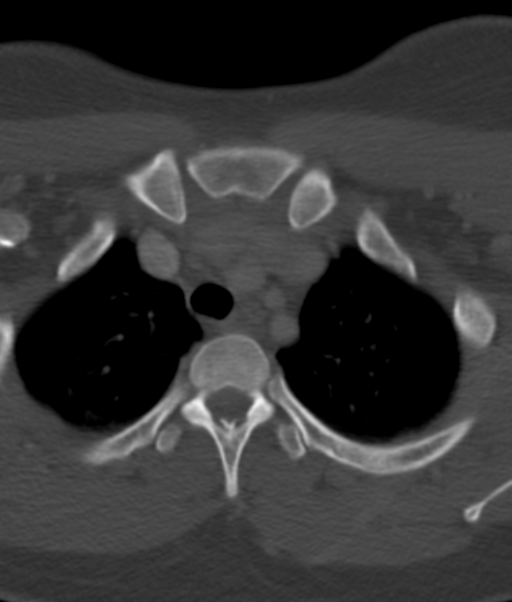
[im 40/118  bone]
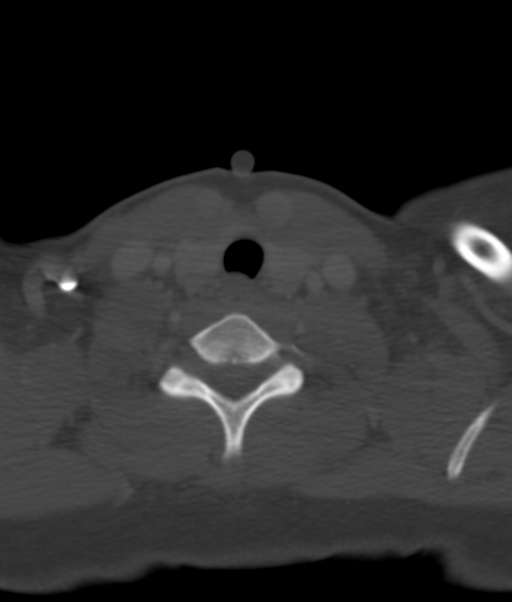
[im 59/118  bone]
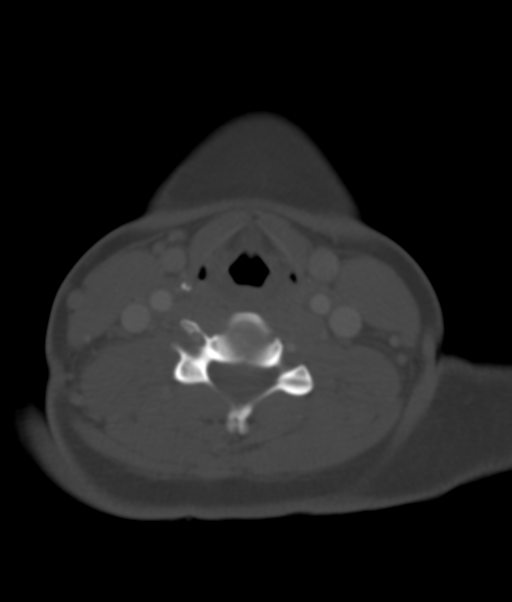
[im 79/118  bone]
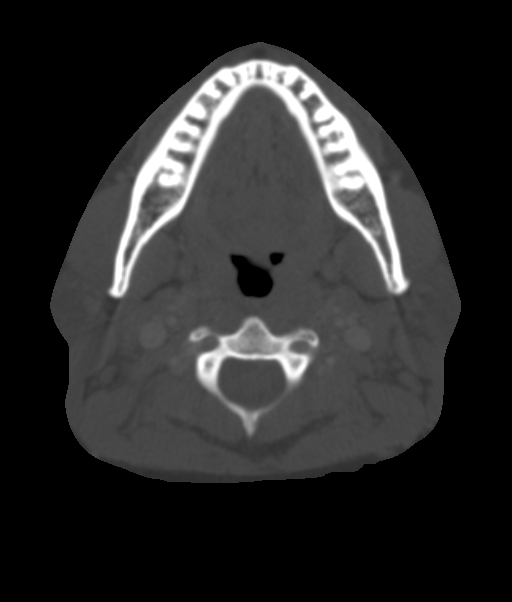
[im 98/118  soft-tissue]
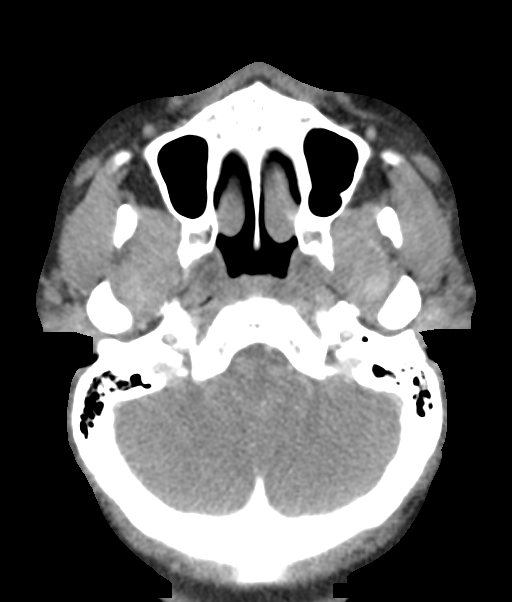
[im 98/118  bone]
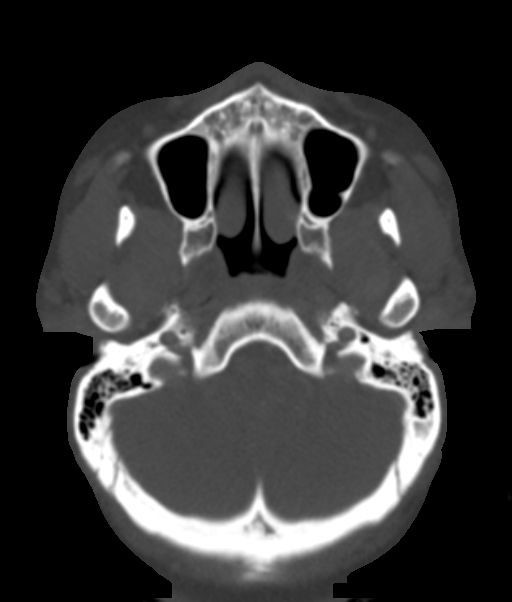

[15 of 33 positions shown; findings below may reference images not displayed]

FINDINGS: Pharynx and larynx: Normal. No radiopaque foreign bodies along the
course of the imaged air digestive track.

Salivary glands: Normal.

Thyroid: Normal.

Lymph nodes: No lymphadenopathy by CT size criteria.

Vascular: Normal.

Limited intracranial: Normal.

Visualized orbits: Normal.

Mastoids and visualized paranasal sinuses: Well aerated.

Skeleton/soft tissues: Marker at the sternomanubrial notch, no
underlying abnormality, the normal appearing RIGHT external jugular
vein courses posterior to the marker. Straightened cervical
lordosis. No destructive bony lesions or acute osseous process.

Upper chest: Lung apices are clear.
IMPRESSION: Normal CT neck with contrast ; no radiopaque foreign bodies.

## 2016-02-08 ENCOUNTER — Encounter (HOSPITAL_BASED_OUTPATIENT_CLINIC_OR_DEPARTMENT_OTHER): Payer: Self-pay

## 2016-02-08 ENCOUNTER — Emergency Department (HOSPITAL_BASED_OUTPATIENT_CLINIC_OR_DEPARTMENT_OTHER)
Admission: EM | Admit: 2016-02-08 | Discharge: 2016-02-08 | Disposition: A | Discharge status: Home / Self Care | Payer: Medicaid Other | Attending: Emergency Medicine | Admitting: Emergency Medicine

## 2016-02-08 DIAGNOSIS — N898 Other specified noninflammatory disorders of vagina: Secondary | ICD-10-CM | POA: Insufficient documentation

## 2016-02-08 LAB — URINALYSIS, ROUTINE W REFLEX MICROSCOPIC
Bilirubin Urine: NEGATIVE
Glucose, UA: NEGATIVE mg/dL
Hgb urine dipstick: NEGATIVE
KETONES UR: NEGATIVE mg/dL
LEUKOCYTES UA: NEGATIVE
NITRITE: NEGATIVE
PH: 6.5 (ref 5.0–8.0)
Protein, ur: NEGATIVE mg/dL
Specific Gravity, Urine: 1.019 (ref 1.005–1.030)

## 2016-02-08 LAB — WET PREP, GENITAL
CLUE CELLS WET PREP: NONE SEEN
Sperm: NONE SEEN
TRICH WET PREP: NONE SEEN
YEAST WET PREP: NONE SEEN

## 2016-02-08 LAB — PREGNANCY, URINE: Preg Test, Ur: NEGATIVE

## 2016-02-08 MED ORDER — LIDOCAINE HCL (PF) 1 % IJ SOLN
INTRAMUSCULAR | Status: AC
  Filled 2016-02-08: qty 5

## 2016-02-08 MED ORDER — AZITHROMYCIN 250 MG PO TABS
1000.0000 mg | ORAL_TABLET | Freq: Once | ORAL | Status: AC
  Administered 2016-02-08: 1000 mg via ORAL
  Filled 2016-02-08: qty 4

## 2016-02-08 MED ORDER — CEFTRIAXONE SODIUM 250 MG IJ SOLR
250.0000 mg | Freq: Once | INTRAMUSCULAR | Status: AC
  Administered 2016-02-08: 250 mg via INTRAMUSCULAR
  Filled 2016-02-08: qty 250

## 2016-02-08 NOTE — ED Notes (Signed)
C/o vaginal d/c x 2 days-NAD-texting in triage-steady gait

## 2016-02-08 NOTE — ED Provider Notes (Signed)
CSN: [id number]     Arrival date & time 02/08/16  1250 History   First MD Initiated Contact with Patient 02/08/16 1312     Chief Complaint  Patient presents with  . Vaginal Discharge   (Consider location/radiation/quality/duration/timing/severity/associated sxs/prior Treatment) HPI Comments: Patient with history of trichomoniasis, genital herpes -- presents with complaint of vaginal discharge for the past 2 days. Patient describes a white, foul-smelling discharge. She has had similar symptoms in the past with trichomonas and bacterial vaginosis. Otherwise has had some minor anterior pelvic pain. No fevers, nausea, vomiting, diarrhea, urinary symptoms. No treatments prior to arrival. Patient is sexually active with one partner and has unprotected sexual intercourse. The onset of this condition was acute. The course is constant. Aggravating factors: none. Alleviating factors: none.    Patient is a 35 y.o. female presenting with vaginal discharge. The history is provided by the patient.  Vaginal Discharge Associated symptoms: no dysuria and no fever     Past Medical History  Diagnosis Date  . Herpes genitalia   . Reflux   . Anemia    Past Surgical History  Procedure Laterality Date  . Cesarean section     No family history on file. Social History  Substance Use Topics  . Smoking status: Never Smoker   . Smokeless tobacco: Never Used  . Alcohol Use: Yes     Comment: occasional   OB History    No data available     Review of Systems  Constitutional: Negative for fever.  HENT: Negative for sore throat.   Eyes: Negative for discharge.  Gastrointestinal: Negative for rectal pain.  Genitourinary: Positive for vaginal discharge. Negative for dysuria, frequency, vaginal bleeding, genital sores and pelvic pain.  Musculoskeletal: Negative for arthralgias.  Skin: Negative for rash.  Hematological: Negative for adenopathy.    Allergies  Other  Home Medications   Prior to  Admission medications   Not on File   BP 137/93 mmHg  Pulse 79  Temp(Src) 99.2 F (37.3 C) (Oral)  Resp 16  Ht  (1.575 m)  Wt 77.111 kg  BMI 31.09 kg/m2  SpO2 100%  LMP 01/21/2016   Physical Exam  Constitutional: She appears well-developed and well-nourished.  HENT:  Head: Normocephalic and atraumatic.  Eyes: Conjunctivae are normal. Right eye exhibits no discharge. Left eye exhibits no discharge.  Neck: Normal range of motion. Neck supple.  Cardiovascular: Normal rate, regular rhythm and normal heart sounds.   Pulmonary/Chest: Effort normal and breath sounds normal.  Abdominal: Soft. There is no tenderness.  Genitourinary: Uterus normal. There is no rash or tenderness on the right labia. There is no rash or tenderness on the left labia. Uterus is not enlarged. Cervix exhibits no motion tenderness, no discharge and no friability. Right adnexum displays no mass and no tenderness. Left adnexum displays no mass and no tenderness. No erythema or bleeding in the vagina. Tenderness: mild. No foreign body around the vagina. No signs of injury around the vagina. Vaginal discharge (scant white) found.  Neurological: She is alert.  Skin: Skin is warm and dry.  Psychiatric: She has a normal mood and affect.  Nursing note and vitals reviewed.   ED Course  Procedures (including critical care time) Labs Review Labs Reviewed  WET PREP, GENITAL - Abnormal; Notable for the following:    WBC, Wet Prep HPF POC MODERATE (*)    All other components within normal limits  PREGNANCY, URINE  URINALYSIS, ROUTINE W REFLEX MICROSCOPIC (NOT AT [facility])  GC/CHLAMYDIA PROBE AMP ([place]) NOT AT [facility]    1:45 PM Patient seen and examined. Work-up initiated. Medications ordered. Pelvic exam performed with NT chaperone Dorathy Daft(Kayla) by Madilyn FiremanHayes PA-S2 under my direct supervision.   Vital signs reviewed and are as follows: BP 137/93 mmHg  Pulse 79  Temp(Src) 99.2 F (37.3 C) (Oral)  Resp 16  Ht 5\' 2"   (1.575 m)  Wt 77.111 kg  BMI 31.09 kg/m2  SpO2 100%  LMP 01/21/2016  2:46 PM Patient informed of results. Treated with Rocephin and azithromycin. Patient encouraged to follow-up with PCP/GYN.  Patient urged to return with worsening symptoms or other concerns. Patient verbalized understanding and agrees with plan.    MDM   Final diagnoses:  Vaginal discharge   Patient with vaginal discharge. No pelvic pain. Very low suspicion for PID, no cervical motion tenderness or adnexal tenderness. Low prep does not demonstrate yeast infection, Trichomonas, BV. Patient treated for gonorrhea and chlamydia. Follow-up as above.  Renne CriglerJoshua Dawna Jakes, PA-C 02/08/16 1451  Melene Planan Floyd, DO 02/08/16 1523

## 2016-02-08 NOTE — Discharge Instructions (Signed)
Please read and follow all provided instructions.  Your diagnoses today include:  1. Vaginal discharge     Tests performed today include  Wet prep - no yeast infection or trichomonas  Test for gonorrhea and chlamydia.   You will be notified by telephone with any positive results.   Vital signs. See below for your results today.   Medications:  You were treated with azithromycin and rocephin today. These antibiotics treat you for gonorrhea and chlamydia. They do not treat for HIV or syphilis.   Home care instructions:  Read educational materials contained in this packet and follow any instructions provided.   You should tell your partners about your infection and avoid having sex for one week to allow time for the medicine to work.  Sexually transmitted disease testing also available at:   [facility] [place]  [address], [place], phone [phone] or [phone]    Monday - Friday, call for an appointment  [facility] [place]  [address], [place], phone [phone] or [phone]   Monday - Friday, call for an appointment  Return instructions:   Please return to the Emergency Department if you experience worsening symptoms.   Please return if you have any other emergent concerns.  Additional Information:  Your vital signs today were: BP 137/93 mmHg   Pulse 79   Temp(Src) 99.2 F (37.3 C) (Oral)   Resp 16   Ht 5\' 2"  (1.575 m)   Wt 77.111 kg   BMI 31.09 kg/m2   SpO2 100%   LMP 01/21/2016 If your blood pressure (BP) was elevated above 135/85 this visit, please have this repeated by your doctor within one month. --------------

## 2016-02-09 LAB — GC/CHLAMYDIA PROBE AMP (~~LOC~~) NOT AT ARMC
Chlamydia: NEGATIVE
Neisseria Gonorrhea: NEGATIVE

## 2016-06-03 ENCOUNTER — Encounter (HOSPITAL_BASED_OUTPATIENT_CLINIC_OR_DEPARTMENT_OTHER): Payer: Self-pay | Admitting: *Deleted

## 2016-06-03 ENCOUNTER — Emergency Department (HOSPITAL_BASED_OUTPATIENT_CLINIC_OR_DEPARTMENT_OTHER)
Admission: EM | Admit: 2016-06-03 | Discharge: 2016-06-03 | Disposition: A | Discharge status: Home / Self Care | Payer: Medicaid Other | Attending: Emergency Medicine | Admitting: Emergency Medicine

## 2016-06-03 DIAGNOSIS — T7840XA Allergy, unspecified, initial encounter: Secondary | ICD-10-CM | POA: Diagnosis not present

## 2016-06-03 MED ORDER — PREDNISONE 10 MG PO TABS: 20.0000 mg | ORAL_TABLET | Freq: Two times a day (BID) | ORAL | 0 refills | Status: DC

## 2016-06-03 NOTE — ED Provider Notes (Signed)
[facility] Provider Note   CSN: [id number] Arrival date & time: 06/03/16  1814 By signing my name below, I, Latasha HedgerElizabeth Mills, attest that this documentation has been prepared under the direction and in the presence of Geoffery Lyonsouglas Leatha Rohner, MD . Electronically Signed: Levon HedgerElizabeth Mills, Scribe. 06/03/2016. 7:06 PM.   History   Chief Complaint Chief Complaint  Patient presents with  . Allergic Reaction    HPI Latasha Mills is a 35 y.o. female who presents to the Emergency Department complaining of sudden onset, mild rash to her face that started this morning. Per pt, her rash is worsening. She notes associated redness, itching, and increased thirst. She also states that she feels her face is slightly swollen. Pt has taken benadryl with no relief. No new soaps, lotions, detergents, foods, animals, plants, or medications. She denies difficulty breathing or swallowing.   The history is provided by the patient. No language interpreter was used.   Past Medical History:  Diagnosis Date  . Anemia   . Herpes genitalia   . Reflux    There are no active problems to display for this patient.  Past Surgical History:  Procedure Laterality Date  . CESAREAN SECTION      OB History    No data available      Home Medications    Prior to Admission medications   Not on File    Family History History reviewed. No pertinent family history.  Social History Social History  Substance Use Topics  . Smoking status: Never Smoker  . Smokeless tobacco: Never Used  . Alcohol use Yes     Comment: occasional     Allergies   Other   Review of Systems Review of Systems 10 systems reviewed and all are negative for acute change except as noted in the HPI.  Physical Exam Updated Vital Signs BP 156/88 (BP Location: Right Arm)   Pulse 78   Temp 98.1 F (36.7 C) (Oral)   Resp 18   Ht 5\' 2"  (1.575 m)   Wt 170 lb (77.1 kg)   LMP 06/03/2016   SpO2 100%   BMI 31.09 kg/m   Physical  Exam  Constitutional: She is oriented to person, place, and time. She appears well-developed and well-nourished.  HENT:  Head: Normocephalic.  Mouth/Throat: Oropharynx is clear and moist.  No oral swelling   Eyes: EOM are normal.  Neck: Normal range of motion.  Cardiovascular: Normal rate and regular rhythm.  Exam reveals no friction rub.   No murmur heard. Pulmonary/Chest: Effort normal and breath sounds normal. No stridor. She has no wheezes.  Abdominal: She exhibits no distension.  Musculoskeletal: Normal range of motion.  Neurological: She is alert and oriented to person, place, and time.  Skin:  A slight urticarial rash to the forehead and cheeks.   Psychiatric: She has a normal mood and affect.  Nursing note and vitals reviewed.   ED Treatments / Results  DIAGNOSTIC STUDIES:  Oxygen Saturation is 100% on RA, normal by my interpretation.    COORDINATION OF CARE:  7:03 PM Discussed treatment plan which includes prednisone and benadryl with pt at bedside and pt agreed to plan.   Labs (all labs ordered are listed, but only abnormal results are displayed) Labs Reviewed - No data to display  EKG  EKG Interpretation None       Radiology No results found.  Procedures Procedures (including critical care time)  Medications Ordered in ED Medications - No data to display  Initial Impression / Assessment and Plan / ED Course  I have reviewed the triage vital signs and the nursing notes.  Pertinent labs & imaging results that were available during my care of the patient were reviewed by me and considered in my medical decision making (see chart for details).  Clinical Course    This patient appears to be experiencing some sort of allergic reaction. She will be treated with steroids, antihistamines, and when necessary return.  Final Clinical Impressions(s) / ED Diagnoses   Final diagnoses:  None    New Prescriptions New Prescriptions   No medications on  file  I personally performed the services described in this documentation, which was scribed in my presence. The recorded information has been reviewed and is accurate.        Geoffery Lyonsouglas Mirha Brucato, MD 06/03/16 [phone]

## 2016-06-03 NOTE — Discharge Instructions (Signed)
Prednisone as prescribed.  Benadryl 25 mg every 6 hours for the next 3 days.  Return to the emergency department if you develop difficulty breathing or swallowing, worsening rash, or other new and concerning symptoms.

## 2016-06-03 NOTE — ED Triage Notes (Signed)
Pt reports 'allergic reaction' that started today due to a rash and 'swelling' of her face.  Also reports dehydration.  Pt noted to be on her phone in triage.  No respiratory distress noted.

## 2016-09-11 ENCOUNTER — Encounter (HOSPITAL_BASED_OUTPATIENT_CLINIC_OR_DEPARTMENT_OTHER): Payer: Self-pay | Admitting: Emergency Medicine

## 2016-09-11 ENCOUNTER — Emergency Department (HOSPITAL_BASED_OUTPATIENT_CLINIC_OR_DEPARTMENT_OTHER)
Admission: EM | Admit: 2016-09-11 | Discharge: 2016-09-11 | Disposition: A | Discharge status: Home / Self Care | Payer: Medicaid Other | Attending: Emergency Medicine | Admitting: Emergency Medicine

## 2016-09-11 DIAGNOSIS — R112 Nausea with vomiting, unspecified: Secondary | ICD-10-CM | POA: Insufficient documentation

## 2016-09-11 DIAGNOSIS — R21 Rash and other nonspecific skin eruption: Secondary | ICD-10-CM | POA: Diagnosis not present

## 2016-09-11 DIAGNOSIS — R197 Diarrhea, unspecified: Secondary | ICD-10-CM | POA: Insufficient documentation

## 2016-09-11 LAB — COMPREHENSIVE METABOLIC PANEL
ALT: 12 U/L — AB (ref 14–54)
AST: 18 U/L (ref 15–41)
Albumin: 3.6 g/dL (ref 3.5–5.0)
Alkaline Phosphatase: 46 U/L (ref 38–126)
Anion gap: 6 (ref 5–15)
BILIRUBIN TOTAL: 0.5 mg/dL (ref 0.3–1.2)
BUN: 9 mg/dL (ref 6–20)
CHLORIDE: 108 mmol/L (ref 101–111)
CO2: 24 mmol/L (ref 22–32)
CREATININE: 0.67 mg/dL (ref 0.44–1.00)
Calcium: 8.2 mg/dL — ABNORMAL LOW (ref 8.9–10.3)
GFR calc Af Amer: >60 mL/min (ref >60)
Glucose, Bld: 80 mg/dL (ref 65–99)
Potassium: 3.6 mmol/L (ref 3.5–5.1)
Sodium: 138 mmol/L (ref 135–145)
Total Protein: 7.1 g/dL (ref 6.5–8.1)

## 2016-09-11 LAB — URINALYSIS, ROUTINE W REFLEX MICROSCOPIC
BILIRUBIN URINE: NEGATIVE
Glucose, UA: NEGATIVE mg/dL
HGB URINE DIPSTICK: NEGATIVE
KETONES UR: NEGATIVE mg/dL
LEUKOCYTES UA: NEGATIVE
Nitrite: NEGATIVE
PROTEIN: NEGATIVE mg/dL
Specific Gravity, Urine: 1.024 (ref 1.005–1.030)
pH: 7 (ref 5.0–8.0)

## 2016-09-11 LAB — CBC
HCT: 36.7 % (ref 36.0–46.0)
Hemoglobin: 12.5 g/dL (ref 12.0–15.0)
MCH: 30.4 pg (ref 26.0–34.0)
MCHC: 34.1 g/dL (ref 30.0–36.0)
MCV: 89.3 fL (ref 78.0–100.0)
PLATELETS: 257 10*3/uL (ref 150–400)
RBC: 4.11 MIL/uL (ref 3.87–5.11)
RDW: 12.9 % (ref 11.5–15.5)
WBC: 8.7 10*3/uL (ref 4.0–10.5)

## 2016-09-11 LAB — PREGNANCY, URINE: Preg Test, Ur: NEGATIVE

## 2016-09-11 LAB — LIPASE, BLOOD: LIPASE: 27 U/L (ref 11–51)

## 2016-09-11 MED ORDER — ONDANSETRON HCL 4 MG PO TABS: 4.0000 mg | ORAL_TABLET | Freq: Four times a day (QID) | ORAL | 0 refills | Status: DC

## 2016-09-11 MED ORDER — SODIUM CHLORIDE 0.9 % IV BOLUS (SEPSIS)
1000.0000 mL | Freq: Once | INTRAVENOUS | Status: AC
  Administered 2016-09-11: 1000 mL via INTRAVENOUS

## 2016-09-11 MED ORDER — ONDANSETRON HCL 4 MG/2ML IJ SOLN
4.0000 mg | Freq: Once | INTRAMUSCULAR | Status: DC
  Filled 2016-09-11: qty 2

## 2016-09-11 MED ORDER — ONDANSETRON HCL 4 MG/2ML IJ SOLN
4.0000 mg | Freq: Once | INTRAMUSCULAR | Status: AC
  Administered 2016-09-11: 4 mg via INTRAVENOUS
  Filled 2016-09-11: qty 2

## 2016-09-11 NOTE — ED Notes (Signed)
Pt given water for PO challenge per order.

## 2016-09-11 NOTE — ED Provider Notes (Signed)
[facility] Note   CSN: [id number] Arrival date & time: 09/11/16  1737 By signing my name below, I, [name], attest that this documentation has been prepared under the direction and in the presence of Tilden FossaElizabeth Deby Adger, MD . Electronically Signed: [name], Scribe. 09/11/2016. 8:36 PM.   History   Chief Complaint Chief Complaint  Patient presents with  . Emesis   HPI Latasha Mills is a 36 y.o. female with a PMHx of anemia and reflux who presents to the Emergency Department complaining of intermittent, gradually worsening vomiting which began this morning. Pt states her symptoms began today after coming home from work. She notes associated nausea, diarrhea, dizziness, and  reports itching, red diffuse rash. Per pt, dizziness is exacerbated by ambulating. No other alleviating or modifying factors noted.  No treatments tried PTA. The patient is currently on no regular medications. She denies any fever or abdominal pain.   The history is provided by the patient. No language interpreter was used.   Past Medical History:  Diagnosis Date  . Anemia   . Herpes genitalia   . Reflux     There are no active problems to display for this patient.   Past Surgical History:  Procedure Laterality Date  . CESAREAN SECTION      OB History    No data available      Home Medications    Prior to Admission medications   Medication Sig Start Date End Date Taking? Authorizing Provider  ondansetron (ZOFRAN) 4 MG tablet Take 1 tablet (4 mg total) by mouth every 6 (six) hours. 09/11/16   Tilden FossaElizabeth Ekin Pilar, MD  predniSONE (DELTASONE) 10 MG tablet Take 2 tablets (20 mg total) by mouth 2 (two) times daily. 06/03/16   Geoffery Lyonsouglas Delo, MD    Family History History reviewed. No pertinent family history.  Social History Social History  Substance Use Topics  . Smoking status: Never Smoker  . Smokeless tobacco: Never Used  . Alcohol use Yes     Comment: occasional      Allergies   Other   Review of Systems Review of Systems 10 systems reviewed and all are negative for acute change except as noted in the HPI.  Physical Exam Updated Vital Signs BP 144/77 (BP Location: Right Arm)   Pulse 70   Temp 98.5 F (36.9 C) (Oral)   Resp 20   Ht 5\' 2"  (1.575 m)   Wt 178 lb (80.7 kg)   LMP 08/09/2016 (Exact Date)   SpO2 100%   BMI 32.56 kg/m   Physical Exam  Constitutional: She is oriented to person, place, and time. She appears well-developed and well-nourished.  HENT:  Head: Normocephalic and atraumatic.  Cardiovascular: Normal rate and regular rhythm.   No murmur heard. Pulmonary/Chest: Effort normal and breath sounds normal. No respiratory distress.  Abdominal: Soft. There is no tenderness. There is no rebound and no guarding.  Musculoskeletal: She exhibits no edema or tenderness.  Neurological: She is alert and oriented to person, place, and time.  Skin: Skin is warm and dry. Rash noted.  Faint macular rash to arms  Psychiatric: She has a normal mood and affect. Her behavior is normal.  Nursing note and vitals reviewed.  ED Treatments / Results  DIAGNOSTIC STUDIES:  Oxygen Saturation is 100% on RA, normal by my interpretation.    COORDINATION OF CARE:  8:32 PM Discussed treatment plan with pt at bedside and pt agreed to plan.  Labs (all labs ordered are  listed, but only abnormal results are displayed) Labs Reviewed  COMPREHENSIVE METABOLIC PANEL - Abnormal; Notable for the following:       Result Value   Calcium 8.2 (*)    ALT 12 (*)    All other components within normal limits  LIPASE, BLOOD  CBC  URINALYSIS, ROUTINE W REFLEX MICROSCOPIC  PREGNANCY, URINE    EKG  EKG Interpretation None       Radiology No results found.  Procedures Procedures (including critical care time)  Medications Ordered in ED Medications  ondansetron (ZOFRAN) injection 4 mg (4 mg Intravenous Refused 09/11/16 2308)  sodium chloride 0.9  % bolus 1,000 mL (0 mLs Intravenous Stopped 09/11/16 2146)  ondansetron (ZOFRAN) injection 4 mg (4 mg Intravenous Given 09/11/16 2039)     Initial Impression / Assessment and Plan / ED Course  I have reviewed the triage vital signs and the nursing notes.  Pertinent labs & imaging results that were available during my care of the patient were reviewed by me and considered in my medical decision making (see chart for details).     Pt here with nausea/vomiting.  She is nontoxic on exam with no abdominal tenderness.  Her sxs improved in ED following antiemetics and IVF.  Clinical picture is not c/w serious bacterial infection, acute appendicitis, cholecystitis.  Counseled patient on home care for likely gastroenteritis with outpatient follow up and return precautions.    Final Clinical Impressions(s) / ED Diagnoses   Final diagnoses:  Nausea vomiting and diarrhea    New Prescriptions Discharge Medication List as of 09/11/2016 10:48 PM    START taking these medications   Details  ondansetron (ZOFRAN) 4 MG tablet Take 1 tablet (4 mg total) by mouth every 6 (six) hours., Starting Sun 09/11/2016, Print       I personally performed the services described in this documentation, which was scribed in my presence. The recorded information has been reviewed and is accurate.    Tilden Fossa, MD 09/12/16 [phone]

## 2016-09-11 NOTE — ED Triage Notes (Signed)
Pt reports n/v with one episode of diarrhea x1 day, chills.  Pt denies abd pain.  Pt also reports red bumps all over body with itching.  Pt alert and oriented.

## 2016-09-11 NOTE — ED Notes (Signed)
Pt given d/c instructions as per chart. rx x 1. Verbalizes understanding. No questions. 

## 2016-10-24 ENCOUNTER — Encounter (HOSPITAL_BASED_OUTPATIENT_CLINIC_OR_DEPARTMENT_OTHER): Payer: Self-pay

## 2016-10-24 ENCOUNTER — Emergency Department (HOSPITAL_BASED_OUTPATIENT_CLINIC_OR_DEPARTMENT_OTHER)
Admission: EM | Admit: 2016-10-24 | Discharge: 2016-10-25 | Disposition: A | Discharge status: Home / Self Care | Payer: Medicaid Other | Attending: Emergency Medicine | Admitting: Emergency Medicine

## 2016-10-24 DIAGNOSIS — R350 Frequency of micturition: Secondary | ICD-10-CM | POA: Diagnosis present

## 2016-10-24 DIAGNOSIS — N3091 Cystitis, unspecified with hematuria: Secondary | ICD-10-CM | POA: Diagnosis not present

## 2016-10-24 LAB — URINALYSIS, ROUTINE W REFLEX MICROSCOPIC
BILIRUBIN URINE: NEGATIVE
GLUCOSE, UA: NEGATIVE mg/dL
Ketones, ur: NEGATIVE mg/dL
Leukocytes, UA: NEGATIVE
Nitrite: NEGATIVE
PH: 6.5 (ref 5.0–8.0)
Protein, ur: NEGATIVE mg/dL
SPECIFIC GRAVITY, URINE: 1.027 (ref 1.005–1.030)

## 2016-10-24 LAB — PREGNANCY, URINE: Preg Test, Ur: NEGATIVE

## 2016-10-24 LAB — URINALYSIS, MICROSCOPIC (REFLEX)

## 2016-10-24 MED ORDER — SULFAMETHOXAZOLE-TRIMETHOPRIM 800-160 MG PO TABS: 1.0000 | ORAL_TABLET | Freq: Two times a day (BID) | ORAL | 0 refills | Status: AC

## 2016-10-24 MED ORDER — PHENAZOPYRIDINE HCL 200 MG PO TABS: 200.0000 mg | ORAL_TABLET | Freq: Three times a day (TID) | ORAL | 0 refills | Status: DC

## 2016-10-24 NOTE — ED Notes (Signed)
Patient claimed that she urinates frequently since last week and she sees streak of blood lately.  Denies of pain.

## 2016-10-24 NOTE — ED Provider Notes (Signed)
[facility]-EMERGENCY DEPT Provider Note   CSN: [id number] Arrival date & time: 10/24/16  2237   By signing my name below, I, Talbert Nan, attest that this documentation has been prepared under the direction and in the presence of Kerrie Buffalo, NP. Electronically Signed: Talbert Nan, Scribe. 10/24/16. 11:54 PM.    History   Chief Complaint Chief Complaint  Patient presents with  . Urinary Frequency    HPI Latasha Mills is a 36 y.o. female who presents to the Emergency Department complaining of increased frequency of urination. Pt has associated blood in urine. This is normally how UTIs start for her. Full exam by gynecologist was done last week. Pt denies vaginal discharge, any N/V/D, any pain anywhere. No concern for STI's since just tested negative.     The history is provided by the patient. No language interpreter was used.    Past Medical History:  Diagnosis Date  . Anemia   . Herpes genitalia   . Reflux     There are no active problems to display for this patient.   Past Surgical History:  Procedure Laterality Date  . CESAREAN SECTION      OB History    No data available       Home Medications    Prior to Admission medications   Medication Sig Start Date End Date Taking? Authorizing Provider  phenazopyridine (PYRIDIUM) 200 MG tablet Take 1 tablet (200 mg total) by mouth 3 (three) times daily. 10/24/16   Hope Orlene Och, NP  sulfamethoxazole-trimethoprim (BACTRIM DS,SEPTRA DS) 800-160 MG tablet Take 1 tablet by mouth 2 (two) times daily. 10/24/16 10/31/16  Hope Orlene Och, NP    Family History No family history on file.  Social History Social History  Substance Use Topics  . Smoking status: Never Smoker  . Smokeless tobacco: Never Used  . Alcohol use Yes     Comment: occasional     Allergies   Other   Review of Systems Review of Systems  Constitutional: Negative for fever.  Respiratory: Negative for shortness of breath.   Gastrointestinal: Negative for  diarrhea, nausea and vomiting.  Genitourinary: Positive for frequency and hematuria. Negative for vaginal bleeding and vaginal discharge.  Musculoskeletal: Negative for arthralgias and myalgias.  Skin: Negative for rash.  Neurological: Negative for headaches.     Physical Exam Updated Vital Signs BP 135/81 (BP Location: Right Arm)   Pulse 77   Temp 98.3 F (36.8 C) (Oral)   Resp 18   Ht 5\' 2"  (1.575 m)   Wt 80.7 kg   LMP 10/09/2016   SpO2 100%   BMI 32.56 kg/m   Physical Exam  Constitutional: She is oriented to person, place, and time. She appears well-developed and well-nourished.  HENT:  Head: Normocephalic and atraumatic.  Cardiovascular: Normal rate.   Pulmonary/Chest: Effort normal.  Abdominal: Soft. Bowel sounds are normal. There is tenderness (mild) in the suprapubic area.  No CVA tenderness.  Neurological: She is alert and oriented to person, place, and time.  Skin: Skin is warm and dry.  Psychiatric: She has a normal mood and affect.  Nursing note and vitals reviewed.    ED Treatments / Results   DIAGNOSTIC STUDIES: Oxygen Saturation is 100% on room air, normal by my interpretation.    COORDINATION OF CARE: 11:52 PM Discussed treatment plan with pt at bedside and pt agreed to plan, which include antibiotics for UTI.    Labs (all labs ordered are listed, but only abnormal results are displayed)  Labs Reviewed  URINE CULTURE - Abnormal; Notable for the following:       Result Value   Culture   (*)    Value: <10,000 COLONIES/mL INSIGNIFICANT GROWTH Performed at [facility], [address] [place], [place] [id number]    All other components within normal limits  URINALYSIS, ROUTINE W REFLEX MICROSCOPIC - Abnormal; Notable for the following:    Hgb urine dipstick MODERATE (*)    All other components within normal limits  URINALYSIS, MICROSCOPIC (REFLEX) - Abnormal; Notable for the following:    Bacteria, UA FEW (*)    Squamous Epithelial / LPF  0-5 (*)    All other components within normal limits  PREGNANCY, URINE    EKG  Radiology No results found.  Procedures Procedures (including critical care time)  Medications Ordered in ED Medications - No data to display   Initial Impression / Assessment and Plan / ED Course   Pt diagnosed with a UTI. Pt is afebrile. She is not tachycardia, hypotension, or have other signs of serious infection.  Pt to be dc home with antibiotics and instructions to follow up with PCP if symptoms persist. Discussed return precautions. Pt appears safe for discharge. Urine culture pending   I have reviewed the triage vital signs and the nursing notes.  Final Clinical Impressions(s) / ED Diagnoses   Final diagnoses:  Cystitis with hematuria    New Prescriptions Discharge Medication List as of 10/24/2016 11:56 PM    START taking these medications   Details  phenazopyridine (PYRIDIUM) 200 MG tablet Take 1 tablet (200 mg total) by mouth 3 (three) times daily., Starting Mon 10/24/2016, Print    sulfamethoxazole-trimethoprim (BACTRIM DS,SEPTRA DS) 800-160 MG tablet Take 1 tablet by mouth 2 (two) times daily., Starting Mon 10/24/2016, Until Mon 10/31/2016, Print      I personally performed the services described in this documentation, which was scribed in my presence. The recorded information has been reviewed and is accurate.    RudolphHope M Neese, NP 10/27/16 0119    Paula LibraJohn Molpus, MD 10/27/16 [phone]

## 2016-10-24 NOTE — ED Triage Notes (Signed)
Pt c/o urinary frequency and hematuria for a week, no fevers, no back pain

## 2016-10-26 LAB — URINE CULTURE: Culture: <10000 — AB

## 2017-04-01 ENCOUNTER — Emergency Department (HOSPITAL_BASED_OUTPATIENT_CLINIC_OR_DEPARTMENT_OTHER)
Admission: EM | Admit: 2017-04-01 | Discharge: 2017-04-01 | Disposition: A | Discharge status: Home / Self Care | Payer: Medicaid Other | Attending: Emergency Medicine | Admitting: Emergency Medicine

## 2017-04-01 ENCOUNTER — Encounter (HOSPITAL_BASED_OUTPATIENT_CLINIC_OR_DEPARTMENT_OTHER): Payer: Self-pay | Admitting: Emergency Medicine

## 2017-04-01 DIAGNOSIS — R630 Anorexia: Secondary | ICD-10-CM | POA: Insufficient documentation

## 2017-04-01 DIAGNOSIS — I10 Essential (primary) hypertension: Secondary | ICD-10-CM | POA: Diagnosis not present

## 2017-04-01 DIAGNOSIS — R531 Weakness: Secondary | ICD-10-CM

## 2017-04-01 HISTORY — DX: Essential (primary) hypertension: I10

## 2017-04-01 LAB — URINALYSIS, ROUTINE W REFLEX MICROSCOPIC
Bilirubin Urine: NEGATIVE
Glucose, UA: NEGATIVE mg/dL
Ketones, ur: 15 mg/dL — AB
LEUKOCYTES UA: NEGATIVE
NITRITE: NEGATIVE
PH: 6 (ref 5.0–8.0)
Protein, ur: NEGATIVE mg/dL
Specific Gravity, Urine: 1.01 (ref 1.005–1.030)

## 2017-04-01 LAB — COMPREHENSIVE METABOLIC PANEL
ALT: 17 U/L (ref 14–54)
ANION GAP: 7 (ref 5–15)
AST: 23 U/L (ref 15–41)
Albumin: 4.3 g/dL (ref 3.5–5.0)
Alkaline Phosphatase: 55 U/L (ref 38–126)
BUN: 7 mg/dL (ref 6–20)
CHLORIDE: 103 mmol/L (ref 101–111)
CO2: 28 mmol/L (ref 22–32)
Calcium: 8.9 mg/dL (ref 8.9–10.3)
Creatinine, Ser: 0.89 mg/dL (ref 0.44–1.00)
Glucose, Bld: 127 mg/dL — ABNORMAL HIGH (ref 65–99)
Potassium: 3.4 mmol/L — ABNORMAL LOW (ref 3.5–5.1)
Sodium: 138 mmol/L (ref 135–145)
Total Bilirubin: 0.4 mg/dL (ref 0.3–1.2)
Total Protein: 8.2 g/dL — ABNORMAL HIGH (ref 6.5–8.1)

## 2017-04-01 LAB — CBC WITH DIFFERENTIAL/PLATELET
BASOS ABS: 0 10*3/uL (ref 0.0–0.1)
Basophils Relative: 0 %
EOS PCT: 1 %
Eosinophils Absolute: 0 10*3/uL (ref 0.0–0.7)
HCT: 38.1 % (ref 36.0–46.0)
Hemoglobin: 12.8 g/dL (ref 12.0–15.0)
LYMPHS ABS: 1.4 10*3/uL (ref 0.7–4.0)
LYMPHS PCT: 26 %
MCH: 29 pg (ref 26.0–34.0)
MCHC: 33.6 g/dL (ref 30.0–36.0)
MCV: 86.4 fL (ref 78.0–100.0)
MONO ABS: 0.3 10*3/uL (ref 0.1–1.0)
Monocytes Relative: 6 %
Neutro Abs: 3.6 10*3/uL (ref 1.7–7.7)
Neutrophils Relative %: 67 %
PLATELETS: 332 10*3/uL (ref 150–400)
RBC: 4.41 MIL/uL (ref 3.87–5.11)
RDW: 13.4 % (ref 11.5–15.5)
WBC: 5.4 10*3/uL (ref 4.0–10.5)

## 2017-04-01 LAB — URINALYSIS, MICROSCOPIC (REFLEX)

## 2017-04-01 LAB — PREGNANCY, URINE: PREG TEST UR: NEGATIVE

## 2017-04-01 MED ORDER — SODIUM CHLORIDE 0.9 % IV BOLUS (SEPSIS)
1000.0000 mL | Freq: Once | INTRAVENOUS | Status: AC
  Administered 2017-04-01: 1000 mL via INTRAVENOUS

## 2017-04-01 MED ORDER — GUAIFENESIN ER 600 MG PO TB12
600.0000 mg | ORAL_TABLET | Freq: Once | ORAL | Status: DC
  Filled 2017-04-01: qty 1

## 2017-04-01 NOTE — ED Triage Notes (Signed)
Patient reports that she has not had anything to eat or drink for the last 2 -3 days because of her "reflux" and has lost 20 lbs in 2 days. - the patient reports that she also is here today because she is having weakness and blood in her urine since last night. Patient is ambulatory in triage and pacing around stating " I just feel so weak, can't I have some water?" Reported to the patient that we are unable to give her water until a Dr. Luberta RobertsonSees her. The patient is upset about this. Patient states that this RN does not understand what she is going through because "you are not in my body" Patient with steady gait, urine was sent to the lab

## 2017-04-01 NOTE — Discharge Instructions (Signed)
Please see your primary care doctor as scheduled on Tuesday. She may need to further give your referrals for GI and urology  Continue to drink plenty of fluids  Return for worsening symptoms, including fever, intractable vomiting, confusion or any other symptoms concerning to you.

## 2017-04-01 NOTE — ED Provider Notes (Signed)
[facility]   CSN: [id number] Arrival date & time: 04/01/17  1016     History   Chief Complaint Chief Complaint  Patient presents with  . Weakness    HPI Latasha Mills is a 36 y.o. female.  HPI  36 year old female who presents with generalized weakness 2 in 3-4 weeks. History of anemia, hypertension, and reflux. [facility], [place], and [facility] with a total 4 ED visits over the past week for the same symptoms. Has had reassuring workup. Was diagnosed with esophagitis and started on Protonix, started on high blood pressure medication and allergy medications. She has a follow-up with her PCP on Tuesday. Reports that she has continued decreased appetite and difficulty swallowing or feeling like she may have a sensation of food that is stuck in her throat. Has had weight loss associated with this. Has not had any nausea, vomiting, regurgitation of food. Has had some loose stools but no abdominal pain. Has also noted some blood in her urine but no flank pain or abdominal pain, dysuria or urinary frequency, fevers or chills. Last menses was a week ago. No cough, chest pain or difficulty pain.  Past Medical History:  Diagnosis Date  . Anemia   . Herpes genitalia   . Hypertension   . Reflux     There are no active problems to display for this patient.   Past Surgical History:  Procedure Laterality Date  . CESAREAN SECTION      OB History    No data available       Home Medications    Prior to Admission medications   Medication Sig Start Date End Date Taking? Authorizing Provider  phenazopyridine (PYRIDIUM) 200 MG tablet Take 1 tablet (200 mg total) by mouth 3 (three) times daily. 10/24/16   Janne Napoleon, NP    Family History History reviewed. No pertinent family history.  Social History Social History  Substance Use Topics  . Smoking status: Never Smoker  . Smokeless  tobacco: Never Used  . Alcohol use Yes     Comment: occasional     Allergies   Other   Review of Systems Review of Systems  Constitutional: Negative for fever.  Respiratory: Negative for shortness of breath.   Cardiovascular: Negative for chest pain.  All other systems reviewed and are negative.    Physical Exam Updated Vital Signs BP 131/62 (BP Location: Left Arm)   Pulse 90   Temp 98.3 F (36.8 C) (Oral)   Resp 18   Ht 5\' 2"  (1.575 m)   Wt 76 kg (167 lb 8.8 oz)   LMP 03/18/2017   SpO2 100%   BMI 30.65 kg/m   Physical Exam Physical Exam  Nursing note and vitals reviewed. Constitutional: Well developed, well nourished, non-toxic, and in no acute distress Head: Normocephalic and atraumatic.  Mouth/Throat: Oropharynx is clear and moist.  Neck: Normal range of motion. Neck supple.  Cardiovascular: Normal rate and regular rhythm.   Pulmonary/Chest: Effort normal and breath sounds normal.  Abdominal: Soft. There is no tenderness. There is no rebound and no guarding.  Musculoskeletal: Normal range of motion.  Neurological: Alert, no facial droop, fluent speech, moves all extremities symmetrically Skin: Skin is warm and dry.  Psychiatric: Cooperative   ED Treatments / Results  Labs (all labs ordered are listed, but only abnormal results are displayed) Labs Reviewed  URINALYSIS, ROUTINE W REFLEX MICROSCOPIC - Abnormal;  Notable for the following:       Result Value   Hgb urine dipstick LARGE (*)    Ketones, ur 15 (*)    All other components within normal limits  URINALYSIS, MICROSCOPIC (REFLEX) - Abnormal; Notable for the following:    Bacteria, UA FEW (*)    Squamous Epithelial / LPF 0-5 (*)    All other components within normal limits  COMPREHENSIVE METABOLIC PANEL - Abnormal; Notable for the following:    Potassium 3.4 (*)    Glucose, Bld 127 (*)    Total Protein 8.2 (*)    All other components within normal limits  PREGNANCY, URINE  CBC WITH  DIFFERENTIAL/PLATELET    EKG  EKG Interpretation None       Radiology No results found.  Procedures Procedures (including critical care time)  Medications Ordered in ED Medications  sodium chloride 0.9 % bolus 1,000 mL (0 mLs Intravenous Stopped 04/01/17 1223)     Initial Impression / Assessment and Plan / ED Course  I have reviewed the triage vital signs and the nursing notes.  Pertinent labs & imaging results that were available during my care of the patient were reviewed by me and considered in my medical decision making (see chart for details).     Records are reviewed. She has had 4 ED visits at other hospitals for same symptoms with reassuring workup. Presentation at this time seems not concerning for serious or emergent process. She is afebrile hemodynamically stable. Still appears well-hydrated and well-nourished. Feels better with IV fluids and may be a component of dehydration. Blood work is reassuring. She is not pregnant urine is unremarkable. His recent chest x-ray a few days ago that was unremarkable. Patient is reassured and encouraged to see her PCP as scheduled on Tuesday. Encouraged to continue pushing fluids and getting rest. Strict return and follow-up instructions reviewed. She expressed understanding of all discharge instructions and felt comfortable with the plan of care.   Final Clinical Impressions(s) / ED Diagnoses   Final diagnoses:  Generalized weakness  Decreased appetite    New Prescriptions New Prescriptions   No medications on file     Lavera GuiseLiu, Teanna Elem Duo, MD 04/01/17 1227

## 2017-04-01 NOTE — ED Notes (Signed)
Pt updated on plan of care. Pt resting at this time.

## 2017-09-24 ENCOUNTER — Encounter (HOSPITAL_BASED_OUTPATIENT_CLINIC_OR_DEPARTMENT_OTHER): Payer: Self-pay | Admitting: Emergency Medicine

## 2017-09-24 ENCOUNTER — Other Ambulatory Visit: Payer: Self-pay

## 2017-09-24 ENCOUNTER — Emergency Department (HOSPITAL_BASED_OUTPATIENT_CLINIC_OR_DEPARTMENT_OTHER)
Admission: EM | Admit: 2017-09-24 | Discharge: 2017-09-24 | Disposition: A | Discharge status: Home / Self Care | Payer: Medicaid Other | Attending: Emergency Medicine | Admitting: Emergency Medicine

## 2017-09-24 DIAGNOSIS — N760 Acute vaginitis: Secondary | ICD-10-CM | POA: Diagnosis not present

## 2017-09-24 DIAGNOSIS — N390 Urinary tract infection, site not specified: Secondary | ICD-10-CM | POA: Insufficient documentation

## 2017-09-24 DIAGNOSIS — A5901 Trichomonal vulvovaginitis: Secondary | ICD-10-CM | POA: Diagnosis not present

## 2017-09-24 DIAGNOSIS — A599 Trichomoniasis, unspecified: Secondary | ICD-10-CM

## 2017-09-24 DIAGNOSIS — B9689 Other specified bacterial agents as the cause of diseases classified elsewhere: Secondary | ICD-10-CM | POA: Diagnosis not present

## 2017-09-24 DIAGNOSIS — I10 Essential (primary) hypertension: Secondary | ICD-10-CM | POA: Diagnosis not present

## 2017-09-24 DIAGNOSIS — N898 Other specified noninflammatory disorders of vagina: Secondary | ICD-10-CM | POA: Diagnosis present

## 2017-09-24 LAB — URINALYSIS, ROUTINE W REFLEX MICROSCOPIC
Bilirubin Urine: NEGATIVE
GLUCOSE, UA: NEGATIVE mg/dL
Ketones, ur: NEGATIVE mg/dL
Nitrite: NEGATIVE
Protein, ur: NEGATIVE mg/dL
Specific Gravity, Urine: 1.025 (ref 1.005–1.030)
pH: 6 (ref 5.0–8.0)

## 2017-09-24 LAB — PREGNANCY, URINE: Preg Test, Ur: NEGATIVE

## 2017-09-24 LAB — WET PREP, GENITAL
Sperm: NONE SEEN
YEAST WET PREP: NONE SEEN

## 2017-09-24 LAB — URINALYSIS, MICROSCOPIC (REFLEX)

## 2017-09-24 MED ORDER — CEPHALEXIN 500 MG PO CAPS: 500.0000 mg | ORAL_CAPSULE | Freq: Four times a day (QID) | ORAL | 0 refills | Status: DC

## 2017-09-24 MED ORDER — METRONIDAZOLE 500 MG PO TABS: 500.0000 mg | ORAL_TABLET | Freq: Two times a day (BID) | ORAL | 0 refills | Status: DC

## 2017-09-24 NOTE — ED Triage Notes (Signed)
Vaginal discharge x 1 week.  

## 2017-09-24 NOTE — Discharge Instructions (Signed)
Take Keflex as directed for 5 days.  Take Flagyl for your vaginal discharge which was caused by bacterial vaginosis and trichomonas. If you want additional STD testing please go to the health center or your PCPs office. Return to ED for worsening symptoms, severe abdominal pain with fever, vomiting, blood in urine.

## 2017-09-24 NOTE — ED Provider Notes (Signed)
[facility] EMERGENCY DEPARTMENT Provider Note   CSN: [id number] Arrival date & time: 09/24/17  [id number]     History   Chief Complaint Chief Complaint  Patient presents with  . Vaginal Discharge    HPI Latasha Mills is a 37 y.o. female with past medical history of herpes, hypertension, who presents to ED for evaluation of vaginal discharge for the past week.  She also reports dysuria.  She is unsure if this is a UTI or a bacterial vaginal infection.  She is sexually active with one female partner and is not concerned for any STDs.  She denies any fever, abdominal pain, vomiting, hematuria, abnormal vaginal bleeding.  HPI  Past Medical History:  Diagnosis Date  . Anemia   . Herpes genitalia   . Hypertension   . Reflux     There are no active problems to display for this patient.   Past Surgical History:  Procedure Laterality Date  . CESAREAN SECTION      OB History    No data available       Home Medications    Prior to Admission medications   Medication Sig Start Date End Date Taking? Authorizing Provider  cephALEXin (KEFLEX) 500 MG capsule Take 1 capsule (500 mg total) by mouth 4 (four) times daily. 09/24/17   Talyah Seder, PA-C  metroNIDAZOLE (FLAGYL) 500 MG tablet Take 1 tablet (500 mg total) by mouth 2 (two) times daily. 09/24/17   Sarabella Caprio, PA-C  phenazopyridine (PYRIDIUM) 200 MG tablet Take 1 tablet (200 mg total) by mouth 3 (three) times daily. 10/24/16   Janne NapoleonNeese, Hope M, NP    Family History No family history on file.  Social History Social History   Tobacco Use  . Smoking status: Never Smoker  . Smokeless tobacco: Never Used  Substance Use Topics  . Alcohol use: Yes    Comment: occasional  . Drug use: No     Allergies   Other   Review of Systems Review of Systems  Constitutional: Negative for appetite change, chills and fever.  HENT: Negative for ear pain, rhinorrhea, sneezing and sore throat.   Eyes: Negative for photophobia  and visual disturbance.  Respiratory: Negative for cough, chest tightness, shortness of breath and wheezing.   Cardiovascular: Negative for chest pain and palpitations.  Gastrointestinal: Negative for abdominal pain, blood in stool, constipation, diarrhea, nausea and vomiting.  Genitourinary: Positive for dysuria and vaginal discharge. Negative for frequency, hematuria, urgency, vaginal bleeding and vaginal pain.  Musculoskeletal: Negative for myalgias.  Skin: Negative for rash.  Neurological: Negative for dizziness, weakness and light-headedness.     Physical Exam Updated Vital Signs BP (!) 178/90 (BP Location: Right Arm)   Pulse 87   Temp 97.8 F (36.6 C) (Oral)   Resp 18   Ht 5\' 2"  (1.575 m)   Wt 79.2 kg (174 lb 11.2 oz)   LMP 09/04/2017   SpO2 100%   BMI 31.95 kg/m   Physical Exam  Constitutional: She appears well-developed and well-nourished. No distress.  HENT:  Head: Normocephalic and atraumatic.  Nose: Nose normal.  Eyes: Conjunctivae and EOM are normal. Left eye exhibits no discharge. No scleral icterus.  Neck: Normal range of motion. Neck supple.  Cardiovascular: Normal rate, regular rhythm, normal heart sounds and intact distal pulses. Exam reveals no gallop and no friction rub.  No murmur heard. Pulmonary/Chest: Effort normal and breath sounds normal. No respiratory distress.  Abdominal: Soft. Bowel sounds are normal. She exhibits no distension.  There is no tenderness. There is no guarding.  Genitourinary: There is no tenderness on the right labia. There is no tenderness on the left labia. Cervix exhibits no motion tenderness and no friability. Right adnexum displays no tenderness. Left adnexum displays no tenderness. There is bleeding in the vagina. Vaginal discharge found.  Genitourinary Comments: Pelvic exam: normal external genitalia without evidence of trauma. VULVA: normal appearing vulva with no masses, tenderness or lesion. VAGINA: normal appearing vagina  with thin white vaginal discharge and bleeding noted, no lesions. CERVIX: normal appearing cervix without lesions, cervical motion tenderness absent, cervical os closed with out purulent discharge; No vaginal discharge. Wet prep and DNA probe for chlamydia and GC obtained.   ADNEXA: normal adnexa in size, nontender and no masses UTERUS: uterus is normal size, shape, consistency and nontender.   RN served as Biomedical engineer during exam.  Musculoskeletal: Normal range of motion. She exhibits no edema.  Neurological: She is alert. She exhibits normal muscle tone. Coordination normal.  Skin: Skin is warm and dry. No rash noted.  Psychiatric: She has a normal mood and affect.  Nursing note and vitals reviewed.    ED Treatments / Results  Labs (all labs ordered are listed, but only abnormal results are displayed) Labs Reviewed  WET PREP, GENITAL - Abnormal; Notable for the following components:      Result Value   Trich, Wet Prep PRESENT (*)    Clue Cells Wet Prep HPF POC PRESENT (*)    WBC, Wet Prep HPF POC MANY (*)    All other components within normal limits  URINALYSIS, ROUTINE W REFLEX MICROSCOPIC - Abnormal; Notable for the following components:   Hgb urine dipstick MODERATE (*)    Leukocytes, UA TRACE (*)    All other components within normal limits  URINALYSIS, MICROSCOPIC (REFLEX) - Abnormal; Notable for the following components:   Bacteria, UA MANY (*)    Squamous Epithelial / LPF 0-5 (*)    All other components within normal limits  URINE CULTURE  PREGNANCY, URINE    EKG  EKG Interpretation None       Radiology No results found.  Procedures Procedures (including critical care time)  Medications Ordered in ED Medications - No data to display   Initial Impression / Assessment and Plan / ED Course  I have reviewed the triage vital signs and the nursing notes.  Pertinent labs & imaging results that were available during my care of the patient were reviewed by me and  considered in my medical decision making (see chart for details).     Patient presents to ED for evaluation of vaginal discharge and dysuria for 1 week.  Denies any abdominal pain, hematuria, fevers, bowel changes.  On physical exam she is overall well-appearing.  She does have some thin white vaginal discharge noted on examination.  Wet prep positive for BV, trichomonas.  Urinalysis with evidence of UTI.  Patient declined additional STD testing at this time because she is in a hurry to get to work.  I did encourage her to follow-up with the health department or her PCP for additional testing since her wet prep was positive for trichomonas.  Patient appears stable for discharge at this time.  Strict return precautions given.  Portions of this note were generated with Scientist, clinical (histocompatibility and immunogenetics). Dictation errors may occur despite best attempts at proofreading.   Final Clinical Impressions(s) / ED Diagnoses   Final diagnoses:  Lower urinary tract infectious disease  BV (bacterial vaginosis)  Trichomoniasis    ED Discharge Orders        Ordered    metroNIDAZOLE (FLAGYL) 500 MG tablet  2 times daily     09/24/17 1119    cephALEXin (KEFLEX) 500 MG capsule  4 times daily     09/24/17 [address], PA-C 09/24/17 1121    Arby Barrette, MD 09/24/17 1133

## 2017-09-25 LAB — URINE CULTURE

## 2017-11-10 ENCOUNTER — Emergency Department (HOSPITAL_BASED_OUTPATIENT_CLINIC_OR_DEPARTMENT_OTHER)
Admission: EM | Admit: 2017-11-10 | Discharge: 2017-11-10 | Disposition: A | Discharge status: Home / Self Care | Payer: Medicaid Other | Attending: Emergency Medicine | Admitting: Emergency Medicine

## 2017-11-10 ENCOUNTER — Other Ambulatory Visit: Payer: Self-pay

## 2017-11-10 DIAGNOSIS — N760 Acute vaginitis: Secondary | ICD-10-CM

## 2017-11-10 DIAGNOSIS — N898 Other specified noninflammatory disorders of vagina: Secondary | ICD-10-CM | POA: Diagnosis present

## 2017-11-10 DIAGNOSIS — I10 Essential (primary) hypertension: Secondary | ICD-10-CM | POA: Diagnosis not present

## 2017-11-10 DIAGNOSIS — Z79899 Other long term (current) drug therapy: Secondary | ICD-10-CM | POA: Diagnosis not present

## 2017-11-10 LAB — URINALYSIS, ROUTINE W REFLEX MICROSCOPIC
BILIRUBIN URINE: NEGATIVE
GLUCOSE, UA: NEGATIVE mg/dL
Ketones, ur: 15 mg/dL — AB
Leukocytes, UA: NEGATIVE
Nitrite: NEGATIVE
Protein, ur: NEGATIVE mg/dL
SPECIFIC GRAVITY, URINE: 1.025 (ref 1.005–1.030)
pH: 6.5 (ref 5.0–8.0)

## 2017-11-10 LAB — URINALYSIS, MICROSCOPIC (REFLEX)

## 2017-11-10 LAB — WET PREP, GENITAL
CLUE CELLS WET PREP: NONE SEEN
Sperm: NONE SEEN
Trich, Wet Prep: NONE SEEN
YEAST WET PREP: NONE SEEN

## 2017-11-10 LAB — PREGNANCY, URINE: Preg Test, Ur: NEGATIVE

## 2017-11-10 NOTE — Discharge Instructions (Signed)
Return to the ED with any concerns including pelvic pain, vaginal bleeding, fever, vomiting, or any other alarming symptoms  The tests for HIV, gonorrhea, and chlamydia that were sent will not result for several days, you will be contacted if these are positive and you need treatment

## 2017-11-10 NOTE — ED Triage Notes (Signed)
Patient reports vaginal irritation 2 days ago; states urinary frequency; denies vaginal bleeding; NAD

## 2017-11-10 NOTE — ED Notes (Signed)
Pt discharged to home NAD.  

## 2017-11-10 NOTE — ED Provider Notes (Signed)
[facility] EMERGENCY DEPARTMENT Provider Note   CSN: [id number] Arrival date & time: 11/10/17  [id number]     History   Chief Complaint Chief Complaint  Patient presents with  . Vaginal Discharge    HPI Latasha FennelLaquitta Mills is a 37 y.o. female.  HPI  Patient presents with complaint of vaginal irritation and itching.  She also describes some discharge.  No burning with urination frequency or urgency.  No fever or abdominal pain.  She has a history of chlamydia remotely and history of genital herpes.  She states the symptoms began a couple days ago and would like to be checked.  She has not had any treatment prior to arrival.  There are no other associated systemic symptoms, there are no other alleviating or modifying factors.   Past Medical History:  Diagnosis Date  . Anemia   . Herpes genitalia   . Hypertension   . Reflux     There are no active problems to display for this patient.   Past Surgical History:  Procedure Laterality Date  . CESAREAN SECTION       OB History   None      Home Medications    Prior to Admission medications   Medication Sig Start Date End Date Taking? Authorizing Provider  cephALEXin (KEFLEX) 500 MG capsule Take 1 capsule (500 mg total) by mouth 4 (four) times daily. 09/24/17   Khatri, Hina, PA-C  metroNIDAZOLE (FLAGYL) 500 MG tablet Take 1 tablet (500 mg total) by mouth 2 (two) times daily. 09/24/17   Khatri, Hina, PA-C  phenazopyridine (PYRIDIUM) 200 MG tablet Take 1 tablet (200 mg total) by mouth 3 (three) times daily. 10/24/16   Janne NapoleonNeese, Hope M, NP    Family History No family history on file.  Social History Social History   Tobacco Use  . Smoking status: Never Smoker  . Smokeless tobacco: Never Used  Substance Use Topics  . Alcohol use: Yes    Comment: occasional  . Drug use: No     Allergies   Other   Review of Systems Review of Systems  ROS reviewed and all otherwise negative except for mentioned in  HPI   Physical Exam Updated Vital Signs BP 128/75 (BP Location: Right Arm)   Pulse 78   Temp 98.3 F (36.8 C) (Oral)   Resp 18   Ht 5\' 2"  (1.575 m)   Wt 78.9 kg (174 lb)   LMP 10/23/2017   SpO2 100%   BMI 31.83 kg/m  Vitals reviewed Physical Exam  Physical Examination: General appearance - alert, well appearing, and in no distress Mental status - alert, oriented to person, place, and time Eyes - no conjunctival injection, no scleral icterus Chest - clear to auscultation, no wheezes, rales or rhonchi, symmetric air entry Heart - normal rate, regular rhythm, normal S1, S2, no murmurs, rubs, clicks or gallops Abdomen - soft, nontender, nondistended, no masses or organomegaly Pelvic - normal external genitalia, vulva, vagina, cervix, uterus and adnexa, no CMT, no adnexal tenderness, no discharge present, no lesions Neurological - alert, oriented, normal speech Extremities - peripheral pulses normal, no pedal edema, no clubbing or cyanosis Skin - normal coloration and turgor, no rashes   ED Treatments / Results  Labs (all labs ordered are listed, but only abnormal results are displayed) Labs Reviewed  WET PREP, GENITAL - Abnormal; Notable for the following components:      Result Value   WBC, Wet Prep HPF POC MANY (*)  All other components within normal limits  URINALYSIS, ROUTINE W REFLEX MICROSCOPIC - Abnormal; Notable for the following components:   Hgb urine dipstick TRACE (*)    Ketones, ur 15 (*)    All other components within normal limits  URINALYSIS, MICROSCOPIC (REFLEX) - Abnormal; Notable for the following components:   Bacteria, UA MANY (*)    Squamous Epithelial / LPF 0-5 (*)    All other components within normal limits  PREGNANCY, URINE  HIV ANTIBODY (ROUTINE TESTING)  GC/CHLAMYDIA PROBE AMP () NOT AT [facility]    EKG None  Radiology No results found.  Procedures Procedures (including critical care time)  Medications Ordered in  ED Medications - No data to display   Initial Impression / Assessment and Plan / ED Course  I have reviewed the triage vital signs and the nursing notes.  Pertinent labs & imaging results that were available during my care of the patient were reviewed by me and considered in my medical decision making (see chart for details).     Patient presenting with vaginal itching.  Her pelvic exam is reassuring without any erythema or lesions or discharge.  She has no CMT or adnexal tenderness.  Her wet prep is reassuring gonorrhea and Chlamydia cultures were taken as well as HIV.  Patient has recently started using a new soap and she thinks this may be causing her irritation.  She will stop using the soap and go back to her old one.  Advised follow-up with gynecology if symptoms persist or worsen.  Discharged with strict return precautions.  Pt agreeable with plan.  Final Clinical Impressions(s) / ED Diagnoses   Final diagnoses:  Acute vaginitis    ED Discharge Orders    None       Phillis Haggis, MD 11/10/17 [phone]

## 2017-11-10 NOTE — ED Notes (Signed)
ED Provider at bedside. 

## 2017-11-11 LAB — HIV ANTIBODY (ROUTINE TESTING W REFLEX): HIV Screen 4th Generation wRfx: NONREACTIVE

## 2017-11-13 LAB — GC/CHLAMYDIA PROBE AMP (~~LOC~~) NOT AT ARMC
CHLAMYDIA, DNA PROBE: NEGATIVE
NEISSERIA GONORRHEA: NEGATIVE

## 2017-12-08 ENCOUNTER — Emergency Department (HOSPITAL_BASED_OUTPATIENT_CLINIC_OR_DEPARTMENT_OTHER)
Admission: EM | Admit: 2017-12-08 | Discharge: 2017-12-08 | Disposition: A | Discharge status: Home / Self Care | Payer: Medicaid Other | Attending: Emergency Medicine | Admitting: Emergency Medicine

## 2017-12-08 ENCOUNTER — Other Ambulatory Visit: Payer: Self-pay

## 2017-12-08 ENCOUNTER — Encounter (HOSPITAL_BASED_OUTPATIENT_CLINIC_OR_DEPARTMENT_OTHER): Payer: Self-pay | Admitting: *Deleted

## 2017-12-08 DIAGNOSIS — Z79899 Other long term (current) drug therapy: Secondary | ICD-10-CM | POA: Insufficient documentation

## 2017-12-08 DIAGNOSIS — I10 Essential (primary) hypertension: Secondary | ICD-10-CM | POA: Insufficient documentation

## 2017-12-08 DIAGNOSIS — J3489 Other specified disorders of nose and nasal sinuses: Secondary | ICD-10-CM

## 2017-12-08 DIAGNOSIS — R51 Headache: Secondary | ICD-10-CM | POA: Diagnosis present

## 2017-12-08 DIAGNOSIS — R519 Headache, unspecified: Secondary | ICD-10-CM

## 2017-12-08 MED ORDER — KETOROLAC TROMETHAMINE 30 MG/ML IJ SOLN
30.0000 mg | Freq: Once | INTRAMUSCULAR | Status: AC
  Administered 2017-12-08: 30 mg via INTRAVENOUS
  Filled 2017-12-08: qty 1

## 2017-12-08 MED ORDER — SODIUM CHLORIDE 0.9 % IV BOLUS
500.0000 mL | Freq: Once | INTRAVENOUS | Status: AC
  Administered 2017-12-08: 500 mL via INTRAVENOUS

## 2017-12-08 MED ORDER — CETIRIZINE HCL 5 MG PO TABS: 5.0000 mg | ORAL_TABLET | Freq: Every day | ORAL | 0 refills | Status: DC

## 2017-12-08 MED ORDER — METOCLOPRAMIDE HCL 5 MG/ML IJ SOLN
10.0000 mg | Freq: Once | INTRAMUSCULAR | Status: AC
  Administered 2017-12-08: 10 mg via INTRAVENOUS
  Filled 2017-12-08: qty 2

## 2017-12-08 MED ORDER — FLUTICASONE PROPIONATE 50 MCG/ACT NA SUSP: 2.0000 | Freq: Every day | NASAL | 0 refills | Status: DC

## 2017-12-08 NOTE — ED Notes (Signed)
ED Provider at bedside. 

## 2017-12-08 NOTE — ED Provider Notes (Signed)
[facility] EMERGENCY DEPARTMENT Provider Note   CSN: [id number] Arrival date & time: 12/08/17  1727     History   Chief Complaint Chief Complaint  Patient presents with  . Headache    HPI Latasha Mills is a 37 y.o. female.  The history is provided by the patient and medical records. No language interpreter was used.  Headache     Latasha Mills is a 37 y.o. female  with a PMH as listed below who presents to the Emergency Department complaining of bilateral frontal headache over the last week.  Patient endorses that she has been very stressed both at home and work.  She reports left sided neck tightness - she believes she can feel a knot to the area which is tender. She also has been struggling with nasal congestion and sinuses due to the pollen.  She has been taking Zyrtec for this with some improvement.  She has tried no other medications prior to arrival for her symptoms.  No aggravating factors.  Denies fever, chills, visual changes, tinnitus.    Past Medical History:  Diagnosis Date  . Anemia   . Herpes genitalia   . Hypertension   . Reflux     There are no active problems to display for this patient.   Past Surgical History:  Procedure Laterality Date  . CESAREAN SECTION       OB History   None      Home Medications    Prior to Admission medications   Medication Sig Start Date End Date Taking? Authorizing Provider  cephALEXin (KEFLEX) 500 MG capsule Take 1 capsule (500 mg total) by mouth 4 (four) times daily. 09/24/17   Khatri, Hina, PA-C  cetirizine (ZYRTEC) 5 MG tablet Take 1 tablet (5 mg total) by mouth daily. 12/08/17   Daney Moor, Chase Picket, PA-C  fluticasone (FLONASE) 50 MCG/ACT nasal spray Place 2 sprays into both nostrils daily. 12/08/17   Teira Arcilla, Chase Picket, PA-C  metroNIDAZOLE (FLAGYL) 500 MG tablet Take 1 tablet (500 mg total) by mouth 2 (two) times daily. 09/24/17   Khatri, Hina, PA-C  phenazopyridine (PYRIDIUM) 200 MG tablet Take 1  tablet (200 mg total) by mouth 3 (three) times daily. 10/24/16   Janne Napoleon, NP    Family History History reviewed. No pertinent family history.  Social History Social History   Tobacco Use  . Smoking status: Never Smoker  . Smokeless tobacco: Never Used  Substance Use Topics  . Alcohol use: Yes    Comment: occasional  . Drug use: No     Allergies   Other   Review of Systems Review of Systems  HENT: Positive for congestion, sinus pressure and sinus pain.   Neurological: Positive for headaches. Negative for dizziness and weakness.  All other systems reviewed and are negative.    Physical Exam Updated Vital Signs BP 139/75   Pulse 75   Temp 98.2 F (36.8 C)   Resp 16   Ht  (1.575 m)   Wt 78.9 kg (174 lb)   LMP 11/06/2017   SpO2 100%   BMI 31.83 kg/m   Physical Exam  Constitutional: She is oriented to person, place, and time. She appears well-developed and well-nourished. No distress.  HENT:  Head: Normocephalic and atraumatic.  Mouth/Throat: Oropharynx is clear and moist.  No focal areas of sinus tenderness. + nasal discharge. No tenderness of the temporal artery   Eyes: Pupils are equal, round, and reactive to light. Conjunctivae and EOM  are normal. No scleral icterus.  No nystagmus   Neck: Normal range of motion. Neck supple.  Full active and passive ROM without pain. No nuchal rigidity or meningeal signs.  No midline tenderness.  She does have left paraspinal muscle tenderness.  Cardiovascular: Normal rate, regular rhythm, normal heart sounds and intact distal pulses.  Pulmonary/Chest: Effort normal and breath sounds normal. No respiratory distress. She has no wheezes. She has no rales.  Abdominal: Soft. Bowel sounds are normal. She exhibits no distension. There is no tenderness. There is no rebound and no guarding.  Musculoskeletal: Normal range of motion.  Lymphadenopathy:    She has no cervical adenopathy.  Neurological: She is alert and  oriented to person, place, and time. She has normal reflexes. No cranial nerve deficit. Coordination normal.  Alert, oriented, thought content appropriate, able to give a coherent history. Speech is clear and goal oriented, able to follow commands.  Cranial Nerves:  II:  Peripheral visual fields grossly normal, pupils equal, round, reactive to light III, IV, VI: EOM intact bilaterally, ptosis not present V,VII: smile symmetric, eyes kept closed tightly against resistance, facial light touch sensation equal VIII: hearing grossly normal IX, X: symmetric soft palate movement, uvula elevates symmetrically  XI: bilateral shoulder shrug symmetric and strong XII: midline tongue extension 5/5 muscle strength in upper and lower extremities bilaterally including strong and equal grip strength and dorsiflexion/plantar flexion Sensory to light touch normal in all four extremities.  Normal finger-to-nose and rapid alternating movements. No drift. Steady gait.  Skin: Skin is warm and dry. No rash noted. She is not diaphoretic.  Nursing note and vitals reviewed.    ED Treatments / Results  Labs (all labs ordered are listed, but only abnormal results are displayed) Labs Reviewed - No data to display  EKG None  Radiology No results found.  Procedures Procedures (including critical care time)  Medications Ordered in ED Medications  sodium chloride 0.9 % bolus 500 mL (0 mLs Intravenous Stopped 12/08/17 1835)  ketorolac (TORADOL) 30 MG/ML injection 30 mg (30 mg Intravenous Given 12/08/17 1809)  metoCLOPramide (REGLAN) injection 10 mg (10 mg Intravenous Given 12/08/17 1810)     Initial Impression / Assessment and Plan / ED Course  I have reviewed the triage vital signs and the nursing notes.  Pertinent labs & imaging results that were available during my care of the patient were reviewed by me and considered in my medical decision making (see chart for details).    Latasha Mills is a 37 y.o.  female who presents to ED for frontal headache associated with sinus pressure, nasal congestion. No focal neuro deficits on exam. No focal areas of sinus tenderness on exam. No meningeal signs. Migraine cocktail and fluids given.   On re-evaluation, patient does feel a little improved - still complaining of sinus pressure. Will treat with zyrtec, flonase, mucinex. The patient denies any neurologic symptoms such as visual changes, focal numbness/weakness, balance problems, confusion, or speech difficulty to suggest a life-threatening intracranial process such as intracranial hemorrhage or mass. The patient has no clotting risk factors thus venous sinus thrombosis is unlikely. No fevers, neck pain or nuchal rigidity to suggest meningitis. I feel that the patient is safe for discharge home at this time. PCP follow up strongly encouraged. I have reviewed return precautions including development of neurologic symptoms, confusion, lethargy, difficulty speaking, or new/worsening/concerning symptoms. All questions answered.   Final Clinical Impressions(s) / ED Diagnoses   Final diagnoses:  Bad headache  Sinus  pressure    ED Discharge Orders        Ordered    cetirizine (ZYRTEC) 5 MG tablet  Daily     12/08/17 1831    fluticasone (FLONASE) 50 MCG/ACT nasal spray  Daily     12/08/17 1831       Jakwon Gayton, Chase Picket, PA-C 12/08/17 1843    Melene Plan, DO 12/08/17 2107

## 2017-12-08 NOTE — ED Triage Notes (Signed)
Pt c/o h/a x 1 week increased stress at home

## 2017-12-08 NOTE — Discharge Instructions (Signed)
It was my pleasure taking care of you today!   Drink plenty of fluids at home. This will help with your headache.  Zyrtec and flonase for nasal congestion / sinus pressure.   Please follow up with your primary doctor. Call Monday morning to schedule an appointment.    If you develop worsening headache, new fever, new neck stiffness, rash, weakness, numbness, trouble with your speech, trouble walking, new or worsening symptoms or any concerning symptoms, please return to the ED immediately.

## 2018-04-12 ENCOUNTER — Encounter (HOSPITAL_BASED_OUTPATIENT_CLINIC_OR_DEPARTMENT_OTHER): Payer: Self-pay | Admitting: Emergency Medicine

## 2018-04-12 ENCOUNTER — Other Ambulatory Visit: Payer: Self-pay

## 2018-04-12 ENCOUNTER — Emergency Department (HOSPITAL_BASED_OUTPATIENT_CLINIC_OR_DEPARTMENT_OTHER)
Admission: EM | Admit: 2018-04-12 | Discharge: 2018-04-12 | Disposition: A | Discharge status: Home / Self Care | Payer: Medicaid Other | Attending: Emergency Medicine | Admitting: Emergency Medicine

## 2018-04-12 DIAGNOSIS — R51 Headache: Secondary | ICD-10-CM | POA: Diagnosis not present

## 2018-04-12 DIAGNOSIS — I10 Essential (primary) hypertension: Secondary | ICD-10-CM | POA: Insufficient documentation

## 2018-04-12 DIAGNOSIS — R519 Headache, unspecified: Secondary | ICD-10-CM

## 2018-04-12 NOTE — Discharge Instructions (Signed)
If you develop worsening headache, vomiting, neck stiffness, fever, blurry vision, weakness or numbness in your arms or legs, or any other new/concerning symptoms and return to the ER for evaluation.

## 2018-04-12 NOTE — ED Provider Notes (Signed)
[facility] EMERGENCY DEPARTMENT Provider Note   CSN: [id number] Arrival date & time: 04/12/18  0654     History   Chief Complaint Chief Complaint  Patient presents with  . Headache    HPI Latasha Mills is a 37 y.o. female.  HPI  37 year old female presents with chronic headache.  Has been going on for at least 6 and likely more months.  The patient states that the headache is basically nearly every day.  Woke up with it this morning and was tired of it and came to be evaluated.  She is been to the ER before as well as her PCP.  She is been prescribed Imitrex but does not feel like it helps.  The headache is usually occipital, right worse than left as well as bitemporal.  Feels like a pounding sensation.  There is no nausea or vomiting or weakness/numbness in her extremities.  Occasionally she will get spots in her eyes but no consistent blurry vision.  The headache is not worse during certain part of the day.  Headache today feels unchanged from previous headaches.  Past Medical History:  Diagnosis Date  . Anemia   . Herpes genitalia   . Hypertension   . Reflux     There are no active problems to display for this patient.   Past Surgical History:  Procedure Laterality Date  . CESAREAN SECTION       OB History   None      Home Medications    Prior to Admission medications   Medication Sig Start Date End Date Taking? Authorizing Provider  SUMAtriptan (IMITREX) 50 MG tablet Take 50 mg by mouth every 2 (two) hours as needed for migraine. May repeat in 2 hours if headache persists or recurs.   Yes [provider]    Family History No family history on file.  Social History Social History   Tobacco Use  . Smoking status: Never Smoker  . Smokeless tobacco: Never Used  Substance Use Topics  . Alcohol use: Yes    Comment: occasional  . Drug use: No     Allergies   Other   Review of Systems Review of Systems  Constitutional:  Negative for fever.  Eyes: Negative for photophobia.  Gastrointestinal: Negative for nausea and vomiting.  Musculoskeletal: Negative for neck pain.  Neurological: Positive for headaches. Negative for weakness and numbness.  All other systems reviewed and are negative.    Physical Exam Updated Vital Signs BP (!) 148/91 (BP Location: Left Arm)   Pulse 78   Temp 98.3 F (36.8 C) (Oral)   Resp 18   Ht 5\' 2"  (1.575 m)   Wt 76.2 kg   SpO2 100%   BMI 30.73 kg/m   Physical Exam  Constitutional: She is oriented to person, place, and time. She appears well-developed and well-nourished.  Non-toxic appearance. She does not appear ill. No distress.  HENT:  Head: Normocephalic and atraumatic.    Right Ear: External ear normal.  Left Ear: External ear normal.  Nose: Nose normal.  Eyes: Pupils are equal, round, and reactive to light. EOM are normal. Right eye exhibits no discharge. Left eye exhibits no discharge.  Fundoscopic exam:      The right eye shows no papilledema.  Neck: Normal range of motion. Neck supple.  Cardiovascular: Normal rate, regular rhythm and normal heart sounds.  Pulmonary/Chest: Effort normal and breath sounds normal.  Abdominal: Soft. There is no tenderness.  Neurological: She is alert  and oriented to person, place, and time.  CN 3-12 grossly intact. 5/5 strength in all 4 extremities. Grossly normal sensation. Normal finger to nose. Normal gait  Skin: Skin is warm and dry.  Nursing note and vitals reviewed.    ED Treatments / Results  Labs (all labs ordered are listed, but only abnormal results are displayed) Labs Reviewed - No data to display  EKG None  Radiology No results found.  Procedures Procedures (including critical care time)  Medications Ordered in ED Medications - No data to display   Initial Impression / Assessment and Plan / ED Course  I have reviewed the triage vital signs and the nursing notes.  Pertinent labs & imaging results  that were available during my care of the patient were reviewed by me and considered in my medical decision making (see chart for details).     Patient presents with a chronic headache.  I discussed that given her benign neuro exam, CT imaging would not be most beneficial she probably needs an MRI.  However she does not have an emergent reason to get an MRI today in the ED.  She understands this.  I offered her headache medicine but she declines.  I have stressed the importance of following up with her PCP, who she already has an appointment with tomorrow.  I will also refer her to neurology for these chronic headaches.  Otherwise, I have very low suspicion for an acute CNS infection such as meningitis, acute bleed such as subarachnoid hemorrhage or subdural, venous thrombosis, or other acute CNS emergency such as tumor or stroke. Discussed return precautions.  Final Clinical Impressions(s) / ED Diagnoses   Final diagnoses:  Occipital headache    ED Discharge Orders         Ordered    Ambulatory referral to Neurology    Comments:  An appointment is requested in approximately: 1 week   04/12/18 0744           Pricilla LovelessGoldston, Terral Cooks, MD 04/12/18 [phone]

## 2018-04-12 NOTE — ED Triage Notes (Signed)
Pt c/o headache x 6 months continuous.

## 2018-04-17 ENCOUNTER — Encounter (HOSPITAL_BASED_OUTPATIENT_CLINIC_OR_DEPARTMENT_OTHER): Payer: Self-pay | Admitting: *Deleted

## 2018-04-17 ENCOUNTER — Other Ambulatory Visit: Payer: Self-pay

## 2018-04-17 ENCOUNTER — Emergency Department (HOSPITAL_BASED_OUTPATIENT_CLINIC_OR_DEPARTMENT_OTHER)
Admission: EM | Admit: 2018-04-17 | Discharge: 2018-04-17 | Disposition: A | Discharge status: Home / Self Care | Payer: Medicaid Other | Attending: Emergency Medicine | Admitting: Emergency Medicine

## 2018-04-17 DIAGNOSIS — I10 Essential (primary) hypertension: Secondary | ICD-10-CM | POA: Insufficient documentation

## 2018-04-17 DIAGNOSIS — R220 Localized swelling, mass and lump, head: Secondary | ICD-10-CM | POA: Diagnosis present

## 2018-04-17 DIAGNOSIS — T7840XA Allergy, unspecified, initial encounter: Secondary | ICD-10-CM | POA: Insufficient documentation

## 2018-04-17 DIAGNOSIS — Z79899 Other long term (current) drug therapy: Secondary | ICD-10-CM | POA: Insufficient documentation

## 2018-04-17 NOTE — ED Triage Notes (Signed)
Pt states tightness in face, denies SOB x 4 hrs ago after taking norvasc

## 2018-04-17 NOTE — ED Provider Notes (Signed)
[facility] EMERGENCY DEPARTMENT Provider Note   CSN: [id number] Arrival date & time: 04/17/18  1404     History   Chief Complaint Chief Complaint  Patient presents with  . Allergic Reaction    HPI Latasha Mills is a 37 y.o. female.  HPI  Patient complaining of allergic reaction that she thinks is to medicine.  She was started on lisinopril on Friday and had lip swelling and some scratchiness in her throat.  She did not go to the doctor for that but stopped taking it and spoke to a pharmacy who suggested she talk to her doctor about a different medicine.  On Monday she was feeling fine when she was started on amlodipine which she didn't take until 9/17 ~10:30 am.  She woke up about 1:30pm with the impression that her cheeks were swelling and we more red than normal.  She denies any breathing/throat prblems today.  No drooling or trouble swallowing.  No pain in her face.  She was scared about the reaction she had on Friday and drover herself in to get examined   Past Medical History:  Diagnosis Date  . Anemia   . Herpes genitalia   . Hypertension   . Reflux     There are no active problems to display for this patient.   Past Surgical History:  Procedure Laterality Date  . CESAREAN SECTION       OB History   None      Home Medications    Prior to Admission medications   Medication Sig Start Date End Date Taking? Authorizing Provider  amLODipine (NORVASC) 5 MG tablet Take 5 mg by mouth daily.   Yes [provider]  SUMAtriptan (IMITREX) 50 MG tablet Take 50 mg by mouth every 2 (two) hours as needed for migraine. May repeat in 2 hours if headache persists or recurs.    [provider]    Family History History reviewed. No pertinent family history.  Social History Social History   Tobacco Use  . Smoking status: Never Smoker  . Smokeless tobacco: Never Used  Substance Use Topics  . Alcohol use: Yes    Comment: occasional  .  Drug use: No     Allergies   Lisinopril; Amlodipine; and Other   Review of Systems Review of Systems  Constitutional: Negative for chills, diaphoresis, fatigue and fever.  HENT: Positive for facial swelling. Negative for congestion, drooling, rhinorrhea, sinus pain, sneezing, sore throat, trouble swallowing and voice change.   Eyes: Negative for photophobia and pain.  Respiratory: Negative.   Cardiovascular: Negative.   Gastrointestinal: Negative.   Skin: Positive for color change. Negative for pallor, rash and wound.       Mild redness to bilateral cheeks  Neurological: Negative for dizziness, tremors, syncope, facial asymmetry, speech difficulty, weakness and light-headedness.     Physical Exam Updated Vital Signs BP (!) 143/84   Pulse 89   Temp 98.3 F (36.8 C) (Oral)   Resp 16   Ht 5\' 2"  (1.575 m)   Wt 76 kg   LMP 04/01/2018   SpO2 100%   BMI 30.65 kg/m   Physical Exam  Constitutional: She appears well-developed and well-nourished. No distress.  HENT:  Head: Normocephalic.  Mouth/Throat: No oropharyngeal exudate.  Eyes: Conjunctivae are normal. Right eye exhibits no discharge. Left eye exhibits no discharge. No scleral icterus.  Neck: Normal range of motion. Neck supple. No tracheal deviation present. No thyromegaly present.  Cardiovascular: Normal rate, regular  rhythm and normal heart sounds.  No murmur heard. Pulmonary/Chest: Effort normal and breath sounds normal. No respiratory distress. She has no wheezes.  Lymphadenopathy:    She has no cervical adenopathy.  Neurological: She is alert.  Skin: Skin is warm and dry. Capillary refill takes less than 2 seconds. No rash noted. She is not diaphoretic.  Psychiatric: She has a normal mood and affect. Her behavior is normal. Judgment and thought content normal.     ED Treatments / Results  Labs (all labs ordered are listed, but only abnormal results are displayed) Labs Reviewed - No data to  display  EKG None  Radiology No results found.  Procedures Procedures (including critical care time)  Medications Ordered in ED Medications - No data to display   Initial Impression / Assessment and Plan / ED Course  I have reviewed the triage vital signs and the nursing notes.  Pertinent labs & imaging results that were available during my care of the patient were reviewed by me and considered in my medical decision making (see chart for details).   Patient with recent history of reaction (likely to lisinipril) presents after having some swelling in her cheeks s/p amlodipine.  Stable to exam now, will keep in observation for awhile with instructions to d/c all b/p meds until meeting with pcp.  Will add lisinopril/amlodipine to potential reaction list.  Observed till 3:45pm with no worsening of minor swelling/redness on cheeks.  At no time did she had respiratory/throat symptoms/diaphoresis/chest pain and no hypotension.  We discussed return precautions, need to stop amlodipine/lisinopril and see pcp for close followup visit to discuss these potential reactions.  Patient agreed with d/c and asked for a note to go to work tonight.  Final Clinical Impressions(s) / ED Diagnoses   Final diagnoses:  Allergic reaction, initial encounter    ED Discharge Orders    None       Marthenia Rolling, DO 04/17/18 1552    Tegeler, Canary Brim, MD 04/17/18 2004

## 2018-04-17 NOTE — ED Notes (Signed)
ED Provider at bedside. 

## 2018-04-17 NOTE — Discharge Instructions (Signed)
It appears you may have had a reaction to a medication so it's important that you stop taking lisinopril and amlodipine until you have had a chance to discuss these episodes with your primary care doctor.

## 2018-04-18 ENCOUNTER — Other Ambulatory Visit: Payer: Self-pay

## 2018-04-18 ENCOUNTER — Emergency Department (HOSPITAL_BASED_OUTPATIENT_CLINIC_OR_DEPARTMENT_OTHER)
Admission: EM | Admit: 2018-04-18 | Discharge: 2018-04-18 | Disposition: A | Discharge status: Home / Self Care | Payer: Medicaid Other | Attending: Emergency Medicine | Admitting: Emergency Medicine

## 2018-04-18 ENCOUNTER — Encounter (HOSPITAL_BASED_OUTPATIENT_CLINIC_OR_DEPARTMENT_OTHER): Payer: Self-pay | Admitting: Emergency Medicine

## 2018-04-18 DIAGNOSIS — I10 Essential (primary) hypertension: Secondary | ICD-10-CM | POA: Insufficient documentation

## 2018-04-18 DIAGNOSIS — F418 Other specified anxiety disorders: Secondary | ICD-10-CM

## 2018-04-18 DIAGNOSIS — F419 Anxiety disorder, unspecified: Secondary | ICD-10-CM | POA: Insufficient documentation

## 2018-04-18 NOTE — ED Provider Notes (Signed)
TIME SEEN: 4:08 AM  CHIEF COMPLAINT: Anxiety  HPI: Patient is a 37 year old female with history of hypertension who presents to the emergency department with complaints of feeling anxious about having facial swelling.  Was seen here yesterday for the same and had no appreciable facial swelling on examination.  Did have some mild angioedema of her upper lip a week ago after starting lisinopril.  States that her primary care physician switched her to amlodipine and she noticed that she had "bumps on my face" and felt like her upper lip was swollen again.  She has stopped taking this medication as well.  No hives.  She feels like her mouth is dry but no difficulty swallowing, speaking or breathing.  No wheezing.  No dizziness.  She has not noticed any more lip swelling but states that she is just very anxious about it and thinks that "it may all be in my mind".  ROS: See HPI Constitutional: no fever  Eyes: no drainage  ENT: no runny nose   Cardiovascular:  no chest pain  Resp: no SOB  GI: no vomiting GU: no dysuria Integumentary: no rash  Allergy: no hives  Musculoskeletal: no leg swelling  Neurological: no slurred speech ROS otherwise negative  PAST MEDICAL HISTORY/PAST SURGICAL HISTORY:  Past Medical History:  Diagnosis Date  . Anemia   . Herpes genitalia   . Hypertension   . Reflux     MEDICATIONS:  Prior to Admission medications   Medication Sig Start Date End Date Taking? Authorizing Provider  SUMAtriptan (IMITREX) 50 MG tablet Take 50 mg by mouth every 2 (two) hours as needed for migraine. May repeat in 2 hours if headache persists or recurs.    [provider]    ALLERGIES:  Allergies  Allergen Reactions  . Lisinopril     Potential angioedema  . Amlodipine     Potential facial swelling  . Other     "Some color dyes"     SOCIAL HISTORY:  Social History   Tobacco Use  . Smoking status: Never Smoker  . Smokeless tobacco: Never Used  Substance Use Topics   . Alcohol use: Yes    Comment: occasional    FAMILY HISTORY: No family history on file.  EXAM: BP (!) 147/84 (BP Location: Left Arm)   Pulse 88   Temp 98.2 F (36.8 C) (Oral)   Resp 16   Ht 5\' 2"  (1.575 m)   Wt 75.8 kg   LMP 04/01/2018   SpO2 100%   BMI 30.54 kg/m  CONSTITUTIONAL: Alert and oriented and responds appropriately to questions. Well-appearing; well-nourished HEAD: Normocephalic EYES: Conjunctivae clear, pupils appear equal, EOMI ENT: normal nose; moist mucous membranes; No pharyngeal erythema or petechiae, no tonsillar hypertrophy or exudate, no uvular deviation, no unilateral swelling, no trismus or drooling, no muffled voice, normal phonation, no stridor, no dental caries present, no drainable dental abscess noted, no Ludwig's angina, tongue sits flat in the bottom of the mouth, no angioedema, no facial erythema or warmth, no facial swelling; no pain with movement of the neck. NECK: Supple, no meningismus, no nuchal rigidity, no LAD  CARD: RRR; S1 and S2 appreciated; no murmurs, no clicks, no rubs, no gallops RESP: Normal chest excursion without splinting or tachypnea; breath sounds clear and equal bilaterally; no wheezes, no rhonchi, no rales, no hypoxia or respiratory distress, speaking full sentences ABD/GI: Normal bowel sounds; non-distended; soft, non-tender, no rebound, no guarding, no peritoneal signs, no hepatosplenomegaly BACK:  The back appears  normal and is non-tender to palpation, there is no CVA tenderness EXT: Normal ROM in all joints; non-tender to palpation; no edema; normal capillary refill; no cyanosis, no calf tenderness or swelling    SKIN: Normal color for age and race; warm; no rash, no hives NEURO: Moves all extremities equally PSYCH: The patient's mood and manner are appropriate. Grooming and personal hygiene are appropriate.  MEDICAL DECISION MAKING: Patient here with anxiety regarding angioedema.  She has absolutely no facial swelling,  redness or warmth on examination.  There is no angioedema.  Nothing to suggest facial cellulitis, abscess.  Nothing to suggest allergic reaction.  She is phonating normally and swallowing her secretions without difficulty.  No stridor or wheezing.  No hypoxia or hypotension.  She is extremely well-appearing today.  I do not think that she needs steroids or an epinephrine pen.  I have recommended that if she notices that she is having any facial swelling she can take Benadryl at home in case there is any allergic component.  She will follow-up closely with her primary care physician to discuss management of her hypertension.  Blood pressure is currently in the 140s/80s today.  I think a lot of her symptoms today are based on anxiety and she agrees.  We did discuss at length return precautions and signs and symptoms of life-threatening illness.  Patient comfortable with this plan for discharge home.   At this time, I do not feel there is any life-threatening condition present. I have reviewed and discussed all results (EKG, imaging, lab, urine as appropriate) and exam findings with patient/family. I have reviewed nursing notes and appropriate previous records.  I feel the patient is safe to be discharged home without further emergent workup and can continue workup as an outpatient as needed. Discussed usual and customary return precautions. Patient/family verbalize understanding and are comfortable with this plan.  Outpatient follow-up has been provided if needed. All questions have been answered.      Ariadna Setter, Layla Maw, DO 04/18/18 [phone]

## 2018-04-18 NOTE — ED Triage Notes (Signed)
Pt c/o swelling to upper lip and dry mouth. Pt states she started new BP medication yesterday. Pt had lip swelling from lisinopril and was switched to amlodipine.

## 2018-04-18 NOTE — Discharge Instructions (Signed)
There was no sign of any allergic reaction or angioedema today.  If you notice that you have noticed mild lip swelling or rash she may take 50 mg of Benadryl every 8 hours as needed.  If you develop severe lip or tongue swelling, changes in your voice, difficulty breathing or swallowing your own saliva, please call 911.  Please avoid amlodipine and lisinopril at this time and follow-up closely with your primary care physician for discussion for medical management for your hypertension.

## 2018-04-24 ENCOUNTER — Ambulatory Visit: Payer: Medicaid Other | Admitting: Diagnostic Neuroimaging

## 2018-04-24 ENCOUNTER — Encounter: Payer: Self-pay | Admitting: Diagnostic Neuroimaging

## 2018-04-24 ENCOUNTER — Telehealth: Payer: Self-pay | Admitting: Diagnostic Neuroimaging

## 2018-04-24 DIAGNOSIS — G441 Vascular headache, not elsewhere classified: Secondary | ICD-10-CM | POA: Diagnosis not present

## 2018-04-24 MED ORDER — PROPRANOLOL HCL 40 MG PO TABS: 40.0000 mg | ORAL_TABLET | Freq: Two times a day (BID) | ORAL | 6 refills | Status: DC

## 2018-04-24 MED ORDER — RIZATRIPTAN BENZOATE 10 MG PO TBDP: 10.0000 mg | ORAL_TABLET | ORAL | 11 refills | Status: DC | PRN

## 2018-04-24 NOTE — Telephone Encounter (Signed)
Medicaid order sent to GI. They will obtain the auth and will reach out to the pt to schedule.  °

## 2018-04-24 NOTE — Progress Notes (Signed)
[facility]  PATIENT: Latasha Mills DOB: 17-Mar-1981  REFERRING CLINICIAN: Alric Ran, MD HISTORY FROM: patient  REASON FOR VISIT: new consult    HISTORICAL  CHIEF COMPLAINT:  Chief Complaint  Patient presents with  . New Patient (Initial Visit)    Rm 7, alone  . Headache    Dr. Vinnie Level, ED infternal referral.     HISTORY OF PRESENT ILLNESS:   37 year old female for evaluation of headaches.  For past 1 year patient has had intermittent bitemporal, throbbing, severe headaches associated with seeing sparkles and blurred vision.  No nausea or vomiting.  No photophobia or phonophobia.  Headaches can last hours at a time.  Now having headaches 5 to 7 days out of week.  There is family history of headaches in her mother, related hypertension.  Patient has had labile blood pressure in the past, was prescribed lisinopril, but had to stop due to angioedema reaction.  No other specific triggering factors.  No similar headaches earlier in life.    REVIEW OF SYSTEMS: Full 14 system review of systems performed and negative with exception of: As per HPI.  ALLERGIES: Allergies  Allergen Reactions  . Lisinopril     Potential angioedema  . Amlodipine     Potential facial swelling  . Other     "Some color dyes"     HOME MEDICATIONS: Outpatient Medications Prior to Visit  Medication Sig Dispense Refill  . cetirizine (ZYRTEC) 10 MG tablet Frequency:   Dosage:0   MG  Instructions:  Note:1 tab  by mouth every evening for 10 days, the once tab in the evening as needed for allergies    . fluticasone (FLONASE) 50 MCG/ACT nasal spray Place into the nose.    . JUNEL FE 1/20 1-20 MG-MCG tablet TK 1 T PO D  13  . ranitidine (ZANTAC) 150 MG tablet TK 1 T PO BID PRF HEARTBURN  1  . SUMAtriptan (IMITREX) 50 MG tablet Take 50 mg by mouth every 2 (two) hours as needed for migraine. May repeat in 2 hours if headache persists or recurs.    . triamcinolone cream  (KENALOG) 0.1 % Apply topically.     No facility-administered medications prior to visit.     PAST MEDICAL HISTORY: Past Medical History:  Diagnosis Date  . Anemia   . Herpes genitalia   . Hypertension   . Reflux     PAST SURGICAL HISTORY: Past Surgical History:  Procedure Laterality Date  . CESAREAN SECTION      FAMILY HISTORY: No family history on file.  SOCIAL HISTORY: Social History   Socioeconomic History  . Marital status: Single    Spouse name: Not on file  . Number of children: Not on file  . Years of education: Not on file  . Highest education level: Not on file  Occupational History  . Not on file  Social Needs  . Financial resource strain: Not on file  . Food insecurity:    Worry: Not on file    Inability: Not on file  . Transportation needs:    Medical: Not on file    Non-medical: Not on file  Tobacco Use  . Smoking status: Never Smoker  . Smokeless tobacco: Never Used  Substance and Sexual Activity  . Alcohol use: Yes    Comment: occasional  . Drug use: No  . Sexual activity: Yes    Birth control/protection: Surgical  Lifestyle  . Physical activity:    Days per week:  Not on file    Minutes per session: Not on file  . Stress: Not on file  Relationships  . Social connections:    Talks on phone: Not on file    Gets together: Not on file    Attends religious service: Not on file    Active member of club or organization: Not on file    Attends meetings of clubs or organizations: Not on file    Relationship status: Not on file  . Intimate partner violence:    Fear of current or ex partner: Not on file    Emotionally abused: Not on file    Physically abused: Not on file    Forced sexual activity: Not on file  Other Topics Concern  . Not on file  Social History Narrative   Pt is single,lives home with 5 children.    Education some college.  Caffeine soda.  Works for [employer].       PHYSICAL EXAM  GENERAL  EXAM/CONSTITUTIONAL: Vitals:  Vitals:   04/24/18 1341  BP: (!) 143/91  Pulse: 85  Weight: 164 lb 3.2 oz (74.5 kg)  Height: 5\' 2"  (1.575 m)     Body mass index is 30.03 kg/m. Wt Readings from Last 3 Encounters:  04/24/18 164 lb 3.2 oz (74.5 kg)  04/18/18 167 lb (75.8 kg)  04/17/18 167 lb 8.8 oz (76 kg)     Patient is in no distress; well developed, nourished and groomed; neck is supple  CARDIOVASCULAR:  Examination of carotid arteries is normal; no carotid bruits  Regular rate and rhythm, no murmurs  Examination of peripheral vascular system by observation and palpation is normal  EYES:  Ophthalmoscopic exam of optic discs and posterior segments is normal; no papilledema or hemorrhages  Visual Acuity Screening   Right eye Left eye Both eyes  Without correction: 20/30 20/20   With correction:        MUSCULOSKELETAL:  Gait, strength, tone, movements noted in Neurologic exam below  NEUROLOGIC: MENTAL STATUS:  No flowsheet data found.  awake, alert, oriented to person, place and time  recent and remote memory intact  normal attention and concentration  language fluent, comprehension intact, naming intact  fund of knowledge appropriate  CRANIAL NERVE:   2nd - no papilledema on fundoscopic exam  2nd, 3rd, 4th, 6th - pupils equal and reactive to light, visual fields full to confrontation, extraocular muscles intact, no nystagmus  5th - facial sensation symmetric  7th - facial strength symmetric  8th - hearing intact  9th - palate elevates symmetrically, uvula midline  11th - shoulder shrug symmetric  12th - tongue protrusion midline  MOTOR:   normal bulk and tone, full strength in the BUE, BLE  SENSORY:   normal and symmetric to light touch, temperature, vibration  COORDINATION:   finger-nose-finger, fine finger movements normal  REFLEXES:   deep tendon reflexes TRACE and symmetric  GAIT/STATION:   narrow based  gait     DIAGNOSTIC DATA (LABS, IMAGING, TESTING) - I reviewed patient records, labs, notes, testing and imaging myself where available.  Lab Results  Component Value Date   WBC 5.4 04/01/2017   HGB 12.8 04/01/2017   HCT 38.1 04/01/2017   MCV 86.4 04/01/2017   PLT 332 04/01/2017      Component Value Date/Time   NA 138 04/01/2017 1130   K 3.4 (L) 04/01/2017 1130   CL 103 04/01/2017 1130   CO2 28 04/01/2017 1130   GLUCOSE 127 (H)  04/01/2017 1130   BUN 7 04/01/2017 1130   CREATININE 0.89 04/01/2017 1130   CALCIUM 8.9 04/01/2017 1130   PROT 8.2 (H) 04/01/2017 1130   ALBUMIN 4.3 04/01/2017 1130   AST 23 04/01/2017 1130   ALT 17 04/01/2017 1130   ALKPHOS 55 04/01/2017 1130   BILITOT 0.4 04/01/2017 1130   GFRNONAA >60 04/01/2017 1130   GFRAA >60 04/01/2017 1130   No results found for: CHOL, HDL, LDLCALC, LDLDIRECT, TRIG, CHOLHDL No results found for: ZOXW9U No results found for: VITAMINB12 No results found for: TSH   07/02/15 CT head  *No acute intracranial abnormalities. *The appearance of the brain is normal. *Minimal soft tissue swelling anterior to the left maxilla.    ASSESSMENT AND PLAN  37 y.o. year old female here with new onset headaches in the past 1 year, with some migraine features, with severe symptoms and increasing frequency.  We will proceed with further work-up to rule out other secondary causes of headache such as mass, inflammation or infection.  We will also start migraine headache treatments as well.  Dx:  1. Other vascular headache      PLAN:  - check MRI brain (rule out secondary causes of headache; new onset headache x 1 year)  - start propranolol 40mg  twice a day   - rizatriptan 10mg  as needed for breakthrough headache; may repeat x 1 after 2 hours; max 2 tabs per day or 8 per month  - stop sumatriptan (not effective)  Orders Placed This Encounter  Procedures  . MR BRAIN W WO CONTRAST   Meds ordered this encounter  Medications   . propranolol (INDERAL) 40 MG tablet    Sig: Take 1 tablet (40 mg total) by mouth 2 (two) times daily.    Dispense:  60 tablet    Refill:  6  . rizatriptan (MAXALT-MLT) 10 MG disintegrating tablet    Sig: Take 1 tablet (10 mg total) by mouth as needed for migraine. May repeat in 2 hours if needed    Dispense:  9 tablet    Refill:  11   Return in about 6 months (around 10/23/2018).    Suanne Marker, MD 04/24/2018, 2:06 PM Certified in Neurology, Neurophysiology and Neuroimaging  [facility] [address] [place], [place] [address] [phone]

## 2018-04-24 NOTE — Patient Instructions (Signed)
-   check MRI brain  - start propranolol 40mg  twice a day (migraine prevention)  - rizatriptan 10mg  as needed for breakthrough headache; may repeat x 1 after 2 hours; max 2 tabs per day or 8 per month  - stop sumatriptan

## 2018-05-01 ENCOUNTER — Other Ambulatory Visit: Payer: Self-pay

## 2018-05-01 ENCOUNTER — Emergency Department (HOSPITAL_BASED_OUTPATIENT_CLINIC_OR_DEPARTMENT_OTHER)
Admission: EM | Admit: 2018-05-01 | Discharge: 2018-05-01 | Disposition: A | Discharge status: Home / Self Care | Payer: Medicaid Other | Attending: Emergency Medicine | Admitting: Emergency Medicine

## 2018-05-01 ENCOUNTER — Encounter (HOSPITAL_BASED_OUTPATIENT_CLINIC_OR_DEPARTMENT_OTHER): Payer: Self-pay | Admitting: Emergency Medicine

## 2018-05-01 DIAGNOSIS — F418 Other specified anxiety disorders: Secondary | ICD-10-CM

## 2018-05-01 DIAGNOSIS — Z79899 Other long term (current) drug therapy: Secondary | ICD-10-CM | POA: Diagnosis not present

## 2018-05-01 DIAGNOSIS — K219 Gastro-esophageal reflux disease without esophagitis: Secondary | ICD-10-CM

## 2018-05-01 DIAGNOSIS — I10 Essential (primary) hypertension: Secondary | ICD-10-CM | POA: Insufficient documentation

## 2018-05-01 DIAGNOSIS — F419 Anxiety disorder, unspecified: Secondary | ICD-10-CM | POA: Insufficient documentation

## 2018-05-01 MED ORDER — OMEPRAZOLE 40 MG PO CPDR: 40.0000 mg | DELAYED_RELEASE_CAPSULE | Freq: Every day | ORAL | 1 refills | Status: AC

## 2018-05-01 NOTE — Discharge Instructions (Signed)
You may take over-the-counter Tums, Mylanta, Maalox to help with heartburn.  You may continue your ranitidine as needed.  Please follow-up with your primary care physician regarding your blood pressure.  Given you are asymptomatic today, you do not need to be started on medications from the emergency department.  Your blood pressure may be slightly elevated today secondary to being uncomfortable from heartburn and also from anxiety.

## 2018-05-01 NOTE — ED Triage Notes (Signed)
Pt states she is been having high BP all day today and she wants to be check, pt denies any pain, dizziness, nausea or vomiting.

## 2018-05-01 NOTE — ED Provider Notes (Signed)
TIME SEEN: 3:57 AM  CHIEF COMPLAINT: Hypertension  HPI: Patient is a 37 year old female with history of hypertension, anxiety, migraines who presents to the emergency department with concerns for elevated blood pressure tonight.  States she has been checking her blood pressure at home with an automatic cuff and got up tonight because she was having heartburn and checked her blood pressure and it was in the 150s/90s.  She states that this concerned her.  She tried taking rimantadine and drinking ginger ale to help with her heartburn but still states she feels like she is having a sour stomach.  No chest pain or shortness of breath.  No headache.  No vision changes.  No numbness, tingling or focal weakness.  She has been on blood pressure medication in the past but had reactions to lisinopril and amlodipine.  She has been prescribed propanolol for migraines but states she was told to only take it as needed and has not use this medication yet.  She has been seen frequently over the past several weeks for concerns about hypertension and anxiety.  ROS: See HPI Constitutional: no fever  Eyes: no drainage  ENT: no runny nose   Cardiovascular:  no chest pain  Resp: no SOB  GI: no vomiting GU: no dysuria Integumentary: no rash  Allergy: no hives  Musculoskeletal: no leg swelling  Neurological: no slurred speech ROS otherwise negative  PAST MEDICAL HISTORY/PAST SURGICAL HISTORY:  Past Medical History:  Diagnosis Date  . Anemia   . Herpes genitalia   . Hypertension   . Reflux     MEDICATIONS:  Prior to Admission medications   Medication Sig Start Date End Date Taking? Authorizing Provider  cetirizine (ZYRTEC) 10 MG tablet Frequency:   Dosage:0   MG  Instructions:  Note:1 tab  by mouth every evening for 10 days, the once tab in the evening as needed for allergies 11/15/13   [provider]  fluticasone (FLONASE) 50 MCG/ACT nasal spray Place into the nose. 12/12/17   [provider]  JUNEL FE 1/20 1-20 MG-MCG tablet TK 1 T PO D 04/16/18   [provider]  propranolol (INDERAL) 40 MG tablet Take 1 tablet (40 mg total) by mouth 2 (two) times daily. 04/24/18   Penumalli, Glenford Bayley, MD  ranitidine (ZANTAC) 150 MG tablet TK 1 T PO BID PRF HEARTBURN 04/13/18   [provider]  rizatriptan (MAXALT-MLT) 10 MG disintegrating tablet Take 1 tablet (10 mg total) by mouth as needed for migraine. May repeat in 2 hours if needed 04/24/18   Penumalli, Glenford Bayley, MD  triamcinolone cream (KENALOG) 0.1 % Apply topically. 01/25/18   [provider]    ALLERGIES:  Allergies  Allergen Reactions  . Lisinopril     Potential angioedema  . Amlodipine     Potential facial swelling  . Other     "Some color dyes"     SOCIAL HISTORY:  Social History   Tobacco Use  . Smoking status: Never Smoker  . Smokeless tobacco: Never Used  Substance Use Topics  . Alcohol use: Yes    Comment: occasional    FAMILY HISTORY: No family history on file.  EXAM: BP (!) 166/94 (BP Location: Left Arm)   Pulse 74   Temp 98.4 F (36.9 C) (Oral)   Resp 18   Ht 5\' 2"  (1.575 m)   Wt 73.9 kg   LMP 04/23/2018   SpO2 100%   BMI 29.81 kg/m  CONSTITUTIONAL: Alert and oriented  and responds appropriately to questions. Well-appearing; well-nourished HEAD: Normocephalic EYES: Conjunctivae clear, pupils appear equal, EOMI ENT: normal nose; moist mucous membranes NECK: Supple, no meningismus, no nuchal rigidity, no LAD  CARD: RRR; S1 and S2 appreciated; no murmurs, no clicks, no rubs, no gallops RESP: Normal chest excursion without splinting or tachypnea; breath sounds clear and equal bilaterally; no wheezes, no rhonchi, no rales, no hypoxia or respiratory distress, speaking full sentences ABD/GI: Normal bowel sounds; non-distended; soft, non-tender, no rebound, no guarding, no peritoneal signs, no hepatosplenomegaly BACK:  The back appears normal and is non-tender to palpation, there  is no CVA tenderness EXT: Normal ROM in all joints; non-tender to palpation; no edema; normal capillary refill; no cyanosis, no calf tenderness or swelling    SKIN: Normal color for age and race; warm; no rash NEURO: Moves all extremities equally PSYCH: The patient's mood and manner are appropriate. Grooming and personal hygiene are appropriate.  MEDICAL DECISION MAKING: Patient here with concerns for hypertension.  She is mildly hypertensive today but I suspect that this is secondary to anxiety.  I do not feel is appropriate to start her on medications from the emergency department especially given she has had mild reactions to other medications which have caused anxiety and she has had documented blood pressures recently that have been normal.  She has been instructed to eat a low-salt diet.  She states she is trying to eat more healthy.  She has a PCP for follow-up.  She will follow-up with them to discuss whether or not she should be started on regular blood pressure medications.  She is asymptomatic today other than heartburn.  Have offered her a GI cocktail, PPI.  She declines these medications at this time but accepts a prescription for PPI.  We discussed diet changes.  Discussed return precautions with patient.  Reassured her regarding her blood pressure today.  At this time, I do not feel there is any life-threatening condition present. I have reviewed and discussed all results (EKG, imaging, lab, urine as appropriate) and exam findings with patient/family. I have reviewed nursing notes and appropriate previous records.  I feel the patient is safe to be discharged home without further emergent workup and can continue workup as an outpatient as needed. Discussed usual and customary return precautions. Patient/family verbalize understanding and are comfortable with this plan.  Outpatient follow-up has been provided if needed. All questions have been answered.      Taren Dymek, Layla Maw, DO 05/01/18  [phone]

## 2018-05-14 ENCOUNTER — Other Ambulatory Visit: Payer: Self-pay

## 2018-05-23 ENCOUNTER — Ambulatory Visit
Admission: RE | Admit: 2018-05-23 | Discharge: 2018-05-23 | Disposition: A | Discharge status: Home / Self Care | Payer: Medicaid Other | Source: Ambulatory Visit | Attending: Diagnostic Neuroimaging | Admitting: Diagnostic Neuroimaging

## 2018-05-23 DIAGNOSIS — G441 Vascular headache, not elsewhere classified: Secondary | ICD-10-CM

## 2018-05-25 ENCOUNTER — Telehealth: Payer: Self-pay | Admitting: Diagnostic Neuroimaging

## 2018-05-25 MED ORDER — ALPRAZOLAM 0.5 MG PO TABS: ORAL_TABLET | ORAL | 0 refills | Status: DC

## 2018-05-25 NOTE — Addendum Note (Signed)
Addended by: Joycelyn Schmid R on: 05/25/2018 12:12 PM   Modules accepted: Orders

## 2018-05-25 NOTE — Telephone Encounter (Signed)
Xanax prescription faxed to [facility], [place].

## 2018-05-25 NOTE — Telephone Encounter (Signed)
Meds ordered this encounter  Medications  . ALPRAZolam (XANAX) 0.5 MG tablet    Sig: for sedation before MRI scan; take 1 hour before scan; may repeat 15 min before scan    Dispense:  3 tablet    Refill:  0    Suanne Marker, MD 05/25/2018, 12:12 PM Certified in Neurology, Neurophysiology and Neuroimaging  [facility] [address], [address] [place], [place] [address] [phone]

## 2018-05-25 NOTE — Telephone Encounter (Signed)
Pt is asking for something to be called in for her to take before her MRI, pt is claustrophobic [id number] - [place], [place] - [address] AT [facility] [address] [facility] [address] [phone] (Phone) [phone] (Fax)

## 2018-05-29 ENCOUNTER — Ambulatory Visit: Admission: RE | Admit: 2018-05-29 | Payer: Medicaid Other | Source: Ambulatory Visit

## 2018-05-30 ENCOUNTER — Ambulatory Visit
Admission: RE | Admit: 2018-05-30 | Discharge: 2018-05-30 | Disposition: A | Discharge status: Home / Self Care | Payer: Medicaid Other | Source: Ambulatory Visit | Attending: Diagnostic Neuroimaging | Admitting: Diagnostic Neuroimaging

## 2018-05-30 DIAGNOSIS — R51 Headache: Secondary | ICD-10-CM | POA: Diagnosis not present

## 2018-05-30 MED ORDER — GADOBENATE DIMEGLUMINE 529 MG/ML IV SOLN
15.0000 mL | Freq: Once | INTRAVENOUS | Status: AC | PRN
  Administered 2018-05-30: 15 mL via INTRAVENOUS

## 2018-06-01 ENCOUNTER — Telehealth: Payer: Self-pay | Admitting: *Deleted

## 2018-06-01 NOTE — Telephone Encounter (Signed)
Spoke with patient and informed her that her MRI brain result is unremarkable, no new causes for her headaches which is good news. She has stopped sumtriptan and is taking propanolol. She stated she has been doing well on the new medication. Advised she call for any questions before FU in March. She verbalized understanding, appreciation of call.

## 2018-06-16 ENCOUNTER — Other Ambulatory Visit: Payer: Self-pay

## 2018-06-16 ENCOUNTER — Encounter (HOSPITAL_BASED_OUTPATIENT_CLINIC_OR_DEPARTMENT_OTHER): Payer: Self-pay | Admitting: Emergency Medicine

## 2018-06-16 ENCOUNTER — Emergency Department (HOSPITAL_BASED_OUTPATIENT_CLINIC_OR_DEPARTMENT_OTHER)
Admission: EM | Admit: 2018-06-16 | Discharge: 2018-06-17 | Disposition: A | Discharge status: Home / Self Care | Payer: Medicaid Other | Attending: Emergency Medicine | Admitting: Emergency Medicine

## 2018-06-16 DIAGNOSIS — Z79899 Other long term (current) drug therapy: Secondary | ICD-10-CM | POA: Diagnosis not present

## 2018-06-16 DIAGNOSIS — I10 Essential (primary) hypertension: Secondary | ICD-10-CM | POA: Diagnosis not present

## 2018-06-16 DIAGNOSIS — R350 Frequency of micturition: Secondary | ICD-10-CM

## 2018-06-16 DIAGNOSIS — M5489 Other dorsalgia: Secondary | ICD-10-CM | POA: Insufficient documentation

## 2018-06-16 DIAGNOSIS — R300 Dysuria: Secondary | ICD-10-CM | POA: Diagnosis present

## 2018-06-16 NOTE — ED Triage Notes (Signed)
Patient states that she is urinating frequently - she also reports that the " top of my back is hurtin" - the patient states that this has been going on for a while " I have been tryin to come but I have been workin so much I aint been able to get here"

## 2018-06-17 LAB — URINALYSIS, ROUTINE W REFLEX MICROSCOPIC
Bilirubin Urine: NEGATIVE
Glucose, UA: NEGATIVE mg/dL
Ketones, ur: 15 mg/dL — AB
LEUKOCYTES UA: NEGATIVE
NITRITE: NEGATIVE
Protein, ur: NEGATIVE mg/dL
pH: 6 (ref 5.0–8.0)

## 2018-06-17 LAB — URINALYSIS, MICROSCOPIC (REFLEX)

## 2018-06-17 LAB — PREGNANCY, URINE: PREG TEST UR: NEGATIVE

## 2018-06-17 MED ORDER — SULFAMETHOXAZOLE-TRIMETHOPRIM 800-160 MG PO TABS: 1.0000 | ORAL_TABLET | Freq: Two times a day (BID) | ORAL | 0 refills | Status: AC

## 2018-06-17 MED ORDER — SULFAMETHOXAZOLE-TRIMETHOPRIM 800-160 MG PO TABS: 1.0000 | ORAL_TABLET | Freq: Two times a day (BID) | ORAL | 0 refills | Status: DC

## 2018-06-17 NOTE — ED Provider Notes (Signed)
[facility] EMERGENCY DEPARTMENT Provider Note   CSN: [id number] Arrival date & time: 06/16/18  2345     History   Chief Complaint Chief Complaint  Patient presents with  . Dysuria    HPI Latasha Mills is a 37 y.o. female.  Patient is a 37 year old female with history of hypertension presenting for evaluation of back discomfort and urinary frequency.  She states that she had an MRI of her brain 2 weeks ago with contrast.  She was told to drink plenty of fluids.  Since that time she has been drinking more fluids, but also has been urinating more frequently and having an urge to urinate.  She denies any fevers or chills.  She denies any burning with urination.   The history is provided by the patient.    Past Medical History:  Diagnosis Date  . Anemia   . Herpes genitalia   . Hypertension   . Reflux     There are no active problems to display for this patient.   Past Surgical History:  Procedure Laterality Date  . CESAREAN SECTION       OB History   None      Home Medications    Prior to Admission medications   Medication Sig Start Date End Date Taking? Authorizing Provider  ALPRAZolam Prudy Feeler) 0.5 MG tablet for sedation before MRI scan; take 1 hour before scan; may repeat 15 min before scan 05/25/18   Penumalli, Glenford Bayley, MD  cetirizine (ZYRTEC) 10 MG tablet Frequency:   Dosage:0   MG  Instructions:  Note:1 tab  by mouth every evening for 10 days, the once tab in the evening as needed for allergies 11/15/13   [provider]  fluticasone (FLONASE) 50 MCG/ACT nasal spray Place into the nose. 12/12/17   [provider]  JUNEL FE 1/20 1-20 MG-MCG tablet TK 1 T PO D 04/16/18   [provider]  omeprazole (PRILOSEC) 40 MG capsule Take 1 capsule (40 mg total) by mouth daily. 05/01/18   Ward, Layla Maw, DO  propranolol (INDERAL) 40 MG tablet Take 1 tablet (40 mg total) by mouth 2 (two) times daily. 04/24/18   Penumalli, Glenford Bayley, MD    ranitidine (ZANTAC) 150 MG tablet TK 1 T PO BID PRF HEARTBURN 04/13/18   [provider]  rizatriptan (MAXALT-MLT) 10 MG disintegrating tablet Take 1 tablet (10 mg total) by mouth as needed for migraine. May repeat in 2 hours if needed 04/24/18   Penumalli, Glenford Bayley, MD  triamcinolone cream (KENALOG) 0.1 % Apply topically. 01/25/18   [provider]    Family History History reviewed. No pertinent family history.  Social History Social History   Tobacco Use  . Smoking status: Never Smoker  . Smokeless tobacco: Never Used  Substance Use Topics  . Alcohol use: Yes    Comment: occasional  . Drug use: No     Allergies   Lisinopril; Amlodipine; and Other   Review of Systems Review of Systems  All other systems reviewed and are negative.    Physical Exam Updated Vital Signs BP (!) 143/83 (BP Location: Left Arm)   Pulse 73   Temp 98.2 F (36.8 C) (Oral)   Resp 16   Ht 5\' 2"  (1.575 m)   Wt 73.5 kg   LMP 06/01/2018   SpO2 100%   BMI 29.63 kg/m   Physical Exam  Constitutional: She is oriented to person, place, and time. She appears well-developed and well-nourished. No  distress.  HENT:  Head: Normocephalic and atraumatic.  Neck: Normal range of motion. Neck supple.  Cardiovascular: Normal rate and regular rhythm. Exam reveals no gallop and no friction rub.  No murmur heard. Pulmonary/Chest: Effort normal and breath sounds normal. No respiratory distress. She has no wheezes.  Abdominal: Soft. Bowel sounds are normal. She exhibits no distension. There is no tenderness.  Musculoskeletal: Normal range of motion.  Neurological: She is alert and oriented to person, place, and time.  Skin: Skin is warm and dry. She is not diaphoretic.  Nursing note and vitals reviewed.    ED Treatments / Results  Labs (all labs ordered are listed, but only abnormal results are displayed) Labs Reviewed  URINALYSIS, ROUTINE W REFLEX MICROSCOPIC - Abnormal; Notable for  the following components:      Result Value   APPearance HAZY (*)    Specific Gravity, Urine >1.030 (*)    Hgb urine dipstick TRACE (*)    Ketones, ur 15 (*)    All other components within normal limits  URINALYSIS, MICROSCOPIC (REFLEX) - Abnormal; Notable for the following components:   Bacteria, UA MANY (*)    All other components within normal limits  PREGNANCY, URINE    EKG None  Radiology No results found.  Procedures Procedures (including critical care time)  Medications Ordered in ED Medications - No data to display   Initial Impression / Assessment and Plan / ED Course  I have reviewed the triage vital signs and the nursing notes.  Pertinent labs & imaging results that were available during my care of the patient were reviewed by me and considered in my medical decision making (see chart for details).  Patient's urine appears clear.  There are many bacteria, likely related to a contaminated specimen.  She will be discharged with ibuprofen, rest.  I will also prescribe an antibiotic which she is to fill if her symptoms do not improve or worsen.  Final Clinical Impressions(s) / ED Diagnoses   Final diagnoses:  None    ED Discharge Orders    None       Geoffery Lyonselo, Rorie Delmore, MD 06/17/18 [phone]

## 2018-06-17 NOTE — ED Notes (Signed)
C/o urinary freq x 1 week  Denies burning w urination,  No vag dc  States has had back pain that comes and goes

## 2018-06-17 NOTE — Discharge Instructions (Signed)
Drink plenty of fluids and get plenty of rest.  Ibuprofen 600 mg every 6 hours as needed for pain.  Fill the prescription for Bactrim you have been given if symptoms are not improving in the next 2 to 3 days.

## 2018-07-08 ENCOUNTER — Other Ambulatory Visit: Payer: Self-pay

## 2018-07-08 ENCOUNTER — Encounter (HOSPITAL_BASED_OUTPATIENT_CLINIC_OR_DEPARTMENT_OTHER): Payer: Self-pay | Admitting: Emergency Medicine

## 2018-07-08 ENCOUNTER — Emergency Department (HOSPITAL_BASED_OUTPATIENT_CLINIC_OR_DEPARTMENT_OTHER)
Admission: EM | Admit: 2018-07-08 | Discharge: 2018-07-08 | Disposition: A | Discharge status: Home / Self Care | Payer: Medicaid Other | Attending: Emergency Medicine | Admitting: Emergency Medicine

## 2018-07-08 DIAGNOSIS — I10 Essential (primary) hypertension: Secondary | ICD-10-CM | POA: Diagnosis not present

## 2018-07-08 DIAGNOSIS — Z79899 Other long term (current) drug therapy: Secondary | ICD-10-CM | POA: Diagnosis not present

## 2018-07-08 DIAGNOSIS — Z0389 Encounter for observation for other suspected diseases and conditions ruled out: Secondary | ICD-10-CM | POA: Diagnosis not present

## 2018-07-08 DIAGNOSIS — R600 Localized edema: Secondary | ICD-10-CM | POA: Diagnosis present

## 2018-07-08 DIAGNOSIS — T7840XA Allergy, unspecified, initial encounter: Secondary | ICD-10-CM

## 2018-07-08 NOTE — ED Triage Notes (Signed)
Pt states she just ate some chicken and after she started having itchiness on her face and top lip swollen. Pt is able to talk in complete sentences NAD noticed.

## 2018-07-08 NOTE — ED Notes (Signed)
Per pt she had some Benadryl pta to ED.

## 2018-07-11 ENCOUNTER — Emergency Department (HOSPITAL_BASED_OUTPATIENT_CLINIC_OR_DEPARTMENT_OTHER)
Admission: EM | Admit: 2018-07-11 | Discharge: 2018-07-11 | Disposition: A | Discharge status: Home / Self Care | Payer: Medicaid Other | Attending: Emergency Medicine | Admitting: Emergency Medicine

## 2018-07-11 ENCOUNTER — Encounter (HOSPITAL_BASED_OUTPATIENT_CLINIC_OR_DEPARTMENT_OTHER): Payer: Self-pay | Admitting: Emergency Medicine

## 2018-07-11 ENCOUNTER — Other Ambulatory Visit: Payer: Self-pay

## 2018-07-11 DIAGNOSIS — I10 Essential (primary) hypertension: Secondary | ICD-10-CM | POA: Diagnosis not present

## 2018-07-11 DIAGNOSIS — T7840XA Allergy, unspecified, initial encounter: Secondary | ICD-10-CM | POA: Diagnosis not present

## 2018-07-11 DIAGNOSIS — T7840XD Allergy, unspecified, subsequent encounter: Secondary | ICD-10-CM

## 2018-07-11 DIAGNOSIS — R220 Localized swelling, mass and lump, head: Secondary | ICD-10-CM | POA: Insufficient documentation

## 2018-07-11 DIAGNOSIS — Z79899 Other long term (current) drug therapy: Secondary | ICD-10-CM | POA: Diagnosis not present

## 2018-07-11 NOTE — ED Triage Notes (Signed)
Reports facial swelling to left side of face, seen previously for same.  States that swelling began to left side of lip.  States taking benadryl to help without relief.  Patient ambulatory to triage in NAD, speaking in full sentences without difficulty.

## 2018-07-11 NOTE — ED Provider Notes (Signed)
[facility] EMERGENCY DEPARTMENT Provider Note   CSN: [id number] Arrival date & time: 07/11/18  4098     History   Chief Complaint Chief Complaint  Patient presents with  . Facial Swelling    HPI Latasha Mills is a 37 y.o. female presenting for evaluation of facial swelling.  Patient states of the past 5 days, she has been having intermittent episodes of facial swelling.  She is not sure when she is coming in contact with.  She had tried a new detergent, but only use that once and has not used a new detergent since.  Additionally, patient states she got a new weave last week, and thinks this might be the cause of her symptoms.  Today, she had swelling of bilateral cheeks and upper lip.  She took a Benadryl prior to arrival, and states she feels much better.  For the past several days, anytime there has been swelling she is taken a Benadryl and this has completely resolved her symptoms.  When she has a facial swelling, she denies associated tongue swelling, throat swelling, difficulty breathing, cough, chest tightness, nausea, vomiting, or rash.  Patient denies previous allergic reactions.  Patient denies recent fevers, chills, ear pain, nasal congestion, sore throat, cough, or other URI symptoms.  Has a history of hypertension and reflux for which she takes medication.  HPI  Past Medical History:  Diagnosis Date  . Anemia   . Herpes genitalia   . Hypertension   . Reflux     There are no active problems to display for this patient.   Past Surgical History:  Procedure Laterality Date  . CESAREAN SECTION       OB History   None      Home Medications    Prior to Admission medications   Medication Sig Start Date End Date Taking? Authorizing Provider  ALPRAZolam Prudy Feeler) 0.5 MG tablet for sedation before MRI scan; take 1 hour before scan; may repeat 15 min before scan 05/25/18   Penumalli, Glenford Bayley, MD  cetirizine (ZYRTEC) 10 MG tablet Frequency:   Dosage:0    MG  Instructions:  Note:1 tab  by mouth every evening for 10 days, the once tab in the evening as needed for allergies 11/15/13   [provider]  fluticasone (FLONASE) 50 MCG/ACT nasal spray Place into the nose. 12/12/17   [provider]  JUNEL FE 1/20 1-20 MG-MCG tablet TK 1 T PO D 04/16/18   [provider]  omeprazole (PRILOSEC) 40 MG capsule Take 1 capsule (40 mg total) by mouth daily. 05/01/18   Ward, Layla Maw, DO  propranolol (INDERAL) 40 MG tablet Take 1 tablet (40 mg total) by mouth 2 (two) times daily. 04/24/18   Penumalli, Glenford Bayley, MD  ranitidine (ZANTAC) 150 MG tablet TK 1 T PO BID PRF HEARTBURN 04/13/18   [provider]  rizatriptan (MAXALT-MLT) 10 MG disintegrating tablet Take 1 tablet (10 mg total) by mouth as needed for migraine. May repeat in 2 hours if needed 04/24/18   Penumalli, Glenford Bayley, MD  triamcinolone cream (KENALOG) 0.1 % Apply topically. 01/25/18   [provider]    Family History History reviewed. No pertinent family history.  Social History Social History   Tobacco Use  . Smoking status: Never Smoker  . Smokeless tobacco: Never Used  Substance Use Topics  . Alcohol use: Yes    Comment: occasional  . Drug use: No     Allergies   Lisinopril; Amlodipine; and  Other   Review of Systems Review of Systems  Constitutional: Negative for fever.  HENT: Positive for facial swelling (Intermittent, resolved). Negative for sore throat and trouble swallowing.   Eyes: Negative for pain, discharge and itching.  Respiratory: Negative for cough, chest tightness, shortness of breath and wheezing.   Cardiovascular: Negative for chest pain.  Gastrointestinal: Negative for abdominal pain, nausea and vomiting.  Skin: Negative for rash.  Allergic/Immunologic: Negative for immunocompromised state.  Neurological: Negative for dizziness and headaches.  Hematological: Does not bruise/bleed easily.  Psychiatric/Behavioral: Negative for  confusion.     Physical Exam Updated Vital Signs BP (!) 150/89 (BP Location: Left Arm)   Pulse 81   Temp 98.9 F (37.2 C) (Oral)   Resp 16   Ht 5\' 2"  (1.575 m)   Wt 71.7 kg   LMP 06/20/2018 (Approximate)   SpO2 100%   BMI 28.90 kg/m   Physical Exam  Constitutional: She is oriented to person, place, and time. She appears well-developed and well-nourished. No distress.  Sitting comfortably in the bed in no acute distress  HENT:  Head: Normocephalic and atraumatic.  Right Ear: Tympanic membrane, external ear and ear canal normal.  Left Ear: Tympanic membrane, external ear and ear canal normal.  Nose: Nose normal.  Mouth/Throat: Uvula is midline, oropharynx is clear and moist and mucous membranes are normal.  No obvious facial swelling.  OP clear without tonsillar swelling or exudate.  Uvula midline with with palate rise.  TMs nonerythematous nonbulging bilaterally. No signs of airway compromise.   Eyes: Pupils are equal, round, and reactive to light. Conjunctivae and EOM are normal.  Neck: Normal range of motion. Neck supple.  Cardiovascular: Normal rate, regular rhythm and intact distal pulses.  Pulmonary/Chest: Effort normal and breath sounds normal. No respiratory distress. She has no wheezes.  Speaking in full sentences.  Clear lung sounds in all fields.  No signs of respiratory distress.  Abdominal: Soft. She exhibits no distension. There is no tenderness.  Musculoskeletal: Normal range of motion.  Neurological: She is alert and oriented to person, place, and time.  Skin: Skin is warm and dry.  Psychiatric: She has a normal mood and affect.  Nursing note and vitals reviewed.    ED Treatments / Results  Labs (all labs ordered are listed, but only abnormal results are displayed) Labs Reviewed - No data to display  EKG None  Radiology No results found.  Procedures Procedures (including critical care time)  Medications Ordered in ED Medications - No data to  display   Initial Impression / Assessment and Plan / ED Course  I have reviewed the triage vital signs and the nursing notes.  Pertinent labs & imaging results that were available during my care of the patient were reviewed by me and considered in my medical decision making (see chart for details).     Patient presenting for evaluation of intermittent facial swelling over the past several days.  Physical exam reassuring, no obvious facial swelling at this time.  No signs of airway compromise or respiratory distress.  No signs of anaphylaxis.  Symptoms have been completely resolved with Benadryl.  Patient does have a new wheeze which she has been wearing, this may be her source, although as I discussed with patient I cannot guarantee that this is the cause of her reaction.  As such, will send for follow-up with allergy specialist for further evaluation.  Patient to continue Benadryl as needed.  Avoid new exposures.  At this time,  patient appears safe for discharge.  Strict return precautions given.  Patient states she understands and agrees to plan.   Final Clinical Impressions(s) / ED Diagnoses   Final diagnoses:  Facial swelling  Allergic reaction, subsequent encounter    ED Discharge Orders    None       Alveria Apley, PA-C 07/11/18 1027    Little, Ambrose Finland, MD 07/12/18 1321

## 2018-07-11 NOTE — ED Notes (Signed)
Pt verbalized understanding of dc instructions.

## 2018-07-11 NOTE — Discharge Instructions (Signed)
Continue taking Benadryl as needed if there is facial swelling. Avoid new exposures, and avoid wearing your weave. Follow-up with allergy specialist for further evaluation and management. Return to the emergency room with any new, worsening, concerning symptoms.

## 2018-07-15 NOTE — ED Provider Notes (Signed)
[facility] EMERGENCY DEPARTMENT Provider Note   CSN: [id number] Arrival date & time: 07/08/18  1458     History   Chief Complaint Chief Complaint  Patient presents with  . Allergic Reaction    HPI Latasha Mills is a 37 y.o. female.  HPI  37yF with facial and lip swelling. Onset a little bit before arrival shortly after eating chicken. No throat pain/tightness. No dyspnea. No gi complaints. No itching/rash. No dizziness or lightheadedness. No new exposures that she is aware of otherwise.   Past Medical History:  Diagnosis Date  . Anemia   . Herpes genitalia   . Hypertension   . Reflux     There are no active problems to display for this patient.   Past Surgical History:  Procedure Laterality Date  . CESAREAN SECTION       OB History   No obstetric history on file.      Home Medications    Prior to Admission medications   Medication Sig Start Date End Date Taking? Authorizing Provider  ALPRAZolam Prudy Feeler(XANAX) 0.5 MG tablet for sedation before MRI scan; take 1 hour before scan; may repeat 15 min before scan 05/25/18   Penumalli, Glenford BayleyVikram R, MD  cetirizine (ZYRTEC) 10 MG tablet Frequency:   Dosage:0   MG  Instructions:  Note:1 tab  by mouth every evening for 10 days, the once tab in the evening as needed for allergies 11/15/13   [provider]  fluticasone (FLONASE) 50 MCG/ACT nasal spray Place into the nose. 12/12/17   [provider]  JUNEL FE 1/20 1-20 MG-MCG tablet TK 1 T PO D 04/16/18   [provider]  omeprazole (PRILOSEC) 40 MG capsule Take 1 capsule (40 mg total) by mouth daily. 05/01/18   Ward, Layla MawKristen N, DO  propranolol (INDERAL) 40 MG tablet Take 1 tablet (40 mg total) by mouth 2 (two) times daily. 04/24/18   Penumalli, Glenford BayleyVikram R, MD  ranitidine (ZANTAC) 150 MG tablet TK 1 T PO BID PRF HEARTBURN 04/13/18   [provider]  rizatriptan (MAXALT-MLT) 10 MG disintegrating tablet Take 1 tablet (10 mg total) by mouth as  needed for migraine. May repeat in 2 hours if needed 04/24/18   Penumalli, Glenford BayleyVikram R, MD  triamcinolone cream (KENALOG) 0.1 % Apply topically. 01/25/18   [provider]    Family History No family history on file.  Social History Social History   Tobacco Use  . Smoking status: Never Smoker  . Smokeless tobacco: Never Used  Substance Use Topics  . Alcohol use: Yes    Comment: occasional  . Drug use: No     Allergies   Lisinopril; Amlodipine; and Other   Review of Systems Review of Systems  All systems reviewed and negative, other than as noted in HPI.  Physical Exam Updated Vital Signs BP (!) 155/98   Pulse 78   Temp 98 F (36.7 C) (Oral)   Resp 18   Ht 5\' 2"  (1.575 m)   Wt 73.5 kg   LMP 06/20/2018 (Approximate)   SpO2 100%   BMI 29.64 kg/m   Physical Exam Vitals signs and nursing note reviewed.  Constitutional:      General: She is not in acute distress.    Appearance: She is well-developed.  HENT:     Head: Normocephalic and atraumatic.     Right Ear: External ear normal.     Left Ear: External ear normal.     Nose: Nose normal. No  congestion.     Mouth/Throat:     Mouth: Mucous membranes are moist.     Pharynx: Oropharynx is clear. No oropharyngeal exudate or posterior oropharyngeal erythema.  Eyes:     General:        Right eye: No discharge.        Left eye: No discharge.     Extraocular Movements: Extraocular movements intact.     Conjunctiva/sclera: Conjunctivae normal.     Pupils: Pupils are equal, round, and reactive to light.  Neck:     Musculoskeletal: Neck supple.  Cardiovascular:     Rate and Rhythm: Normal rate and regular rhythm.     Heart sounds: Normal heart sounds. No murmur. No friction rub. No gallop.   Pulmonary:     Effort: Pulmonary effort is normal. No respiratory distress.     Breath sounds: Normal breath sounds.  Abdominal:     General: There is no distension.     Palpations: Abdomen is soft.     Tenderness:  There is no abdominal tenderness.  Musculoskeletal:        General: No tenderness.  Skin:    General: Skin is warm and dry.  Neurological:     Mental Status: She is alert.  Psychiatric:        Behavior: Behavior normal.        Thought Content: Thought content normal.      ED Treatments / Results  Labs (all labs ordered are listed, but only abnormal results are displayed) Labs Reviewed - No data to display  EKG None  Radiology No results found.  Procedures Procedures (including critical care time)  Medications Ordered in ED Medications - No data to display   Initial Impression / Assessment and Plan / ED Course  I have reviewed the triage vital signs and the nursing notes.  Pertinent labs & imaging results that were available during my care of the patient were reviewed by me and considered in my medical decision making (see chart for details).     Possible allergic reaction. I do not appreciate any swelling. HD stable. Lungs clear. Prn benadryl. Return precautions discussed.   Final Clinical Impressions(s) / ED Diagnoses   Final diagnoses:  Allergic reaction, initial encounter    ED Discharge Orders    None       Raeford Razor, MD 07/15/18 2056

## 2018-08-14 ENCOUNTER — Emergency Department (HOSPITAL_BASED_OUTPATIENT_CLINIC_OR_DEPARTMENT_OTHER)
Admission: EM | Admit: 2018-08-14 | Discharge: 2018-08-14 | Disposition: A | Discharge status: Home / Self Care | Payer: Medicaid Other | Attending: Emergency Medicine | Admitting: Emergency Medicine

## 2018-08-14 ENCOUNTER — Encounter (HOSPITAL_BASED_OUTPATIENT_CLINIC_OR_DEPARTMENT_OTHER): Payer: Self-pay | Admitting: *Deleted

## 2018-08-14 ENCOUNTER — Other Ambulatory Visit: Payer: Self-pay

## 2018-08-14 DIAGNOSIS — Z79899 Other long term (current) drug therapy: Secondary | ICD-10-CM | POA: Diagnosis not present

## 2018-08-14 DIAGNOSIS — I10 Essential (primary) hypertension: Secondary | ICD-10-CM | POA: Insufficient documentation

## 2018-08-14 NOTE — Discharge Instructions (Addendum)
Take your blood pressure medication as prescribed by your doctor.  Check your blood pressure and record readings as discussed.  Return to ER for chest pain, changes in vision, speech, gait.  Any concerning symptoms.

## 2018-08-14 NOTE — ED Triage Notes (Signed)
She was started on BP medication this am when she went for her check up at her MD's office. She has taken one pill. She went to [facility] and took her BP and it was elevated. She has no symptoms.
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
## 2018-08-14 NOTE — ED Notes (Signed)
Patient is A&Ox4.  No signs of distress noted.  Please see providers complete history and physical exam.  

## 2018-08-15 ENCOUNTER — Encounter (HOSPITAL_BASED_OUTPATIENT_CLINIC_OR_DEPARTMENT_OTHER): Payer: Self-pay | Admitting: Emergency Medicine

## 2018-08-15 ENCOUNTER — Emergency Department (HOSPITAL_BASED_OUTPATIENT_CLINIC_OR_DEPARTMENT_OTHER)
Admission: EM | Admit: 2018-08-15 | Discharge: 2018-08-15 | Disposition: A | Discharge status: Home / Self Care | Payer: Medicaid Other | Attending: Emergency Medicine | Admitting: Emergency Medicine

## 2018-08-15 ENCOUNTER — Other Ambulatory Visit: Payer: Self-pay

## 2018-08-15 DIAGNOSIS — I10 Essential (primary) hypertension: Secondary | ICD-10-CM | POA: Diagnosis not present

## 2018-08-15 DIAGNOSIS — Z79899 Other long term (current) drug therapy: Secondary | ICD-10-CM | POA: Diagnosis not present

## 2018-08-15 MED ORDER — PROPRANOLOL HCL 20 MG PO TABS
20.0000 mg | ORAL_TABLET | Freq: Once | ORAL | Status: DC
  Filled 2018-08-15: qty 1

## 2018-08-15 NOTE — ED Notes (Signed)
ED Provider at bedside. 

## 2018-08-15 NOTE — ED Triage Notes (Signed)
Pt c/o high blood pressure since yesterday event taking her BP medication at home. Pt denies any pain no neuro deficit noticed.

## 2018-08-15 NOTE — ED Provider Notes (Signed)
[facility] Provider Note: Lowella DellJ. Lane Zaydn Gutridge, MD, FACEP  CSN: [id number] MRN: [id number] ARRIVAL: 08/15/18 at 0120 ROOM: MH01/[facility]01   CHIEF COMPLAINT  Hypertension   HISTORY OF PRESENT ILLNESS  08/15/18 1:35 AM Latasha Mills is a 38 y.o. female who was seen in this ED yesterday evening for high blood pressure.  She had been seen by her physician yesterday and was diagnosed with high blood pressure and was prescribed propranolol 40 mg twice daily.  She was instructed to start with half a tablet and to check her blood pressure with an hour taking her medication.  Her blood pressure did not adequately improved and so she came here.  She was without symptoms at the time.  She returns because she woke up this morning and her blood pressure was 170/105.  She became concerned.  Again she denies headache, visual changes, neurologic changes or shortness of breath.    Past Medical History:  Diagnosis Date  . Anemia   . Herpes genitalia   . Hypertension   . Reflux     Past Surgical History:  Procedure Laterality Date  . CESAREAN SECTION      No family history on file.  Social History   Tobacco Use  . Smoking status: Never Smoker  . Smokeless tobacco: Never Used  Substance Use Topics  . Alcohol use: Yes    Comment: occasional  . Drug use: No    Prior to Admission medications   Medication Sig Start Date End Date Taking? Authorizing Provider  ALPRAZolam Prudy Feeler(XANAX) 0.5 MG tablet for sedation before MRI scan; take 1 hour before scan; may repeat 15 min before scan 05/25/18   Penumalli, Glenford BayleyVikram R, MD  cetirizine (ZYRTEC) 10 MG tablet Frequency:   Dosage:0   MG  Instructions:  Note:1 tab  by mouth every evening for 10 days, the once tab in the evening as needed for allergies 11/15/13   [provider]  fluticasone (FLONASE) 50 MCG/ACT nasal spray Place into the nose. 12/12/17   [provider]  JUNEL FE 1/20 1-20 MG-MCG tablet TK 1 T PO D 04/16/18   [provider]  omeprazole (PRILOSEC) 40 MG capsule Take 1 capsule (40 mg total) by mouth daily. 05/01/18   Ward, Layla MawKristen N, DO  propranolol (INDERAL) 40 MG tablet Take 1 tablet (40 mg total) by mouth 2 (two) times daily. 04/24/18   Penumalli, Glenford BayleyVikram R, MD  ranitidine (ZANTAC) 150 MG tablet TK 1 T PO BID PRF HEARTBURN 04/13/18   [provider]  rizatriptan (MAXALT-MLT) 10 MG disintegrating tablet Take 1 tablet (10 mg total) by mouth as needed for migraine. May repeat in 2 hours if needed 04/24/18   Penumalli, Glenford BayleyVikram R, MD  triamcinolone cream (KENALOG) 0.1 % Apply topically. 01/25/18   [provider]    Allergies Lisinopril; Amlodipine; and Other   REVIEW OF SYSTEMS  Negative except as noted here or in the History of Present Illness.   PHYSICAL EXAMINATION  Initial Vital Signs Blood pressure (!) 165/105, pulse 76, temperature 98.7 F (37.1 C), temperature source Oral, resp. rate 16, last menstrual period 08/14/2018, SpO2 100 %.  Examination General: Well-developed, well-nourished female in no acute distress; appearance consistent with age of record HENT: normocephalic; atraumatic Eyes: pupils equal, round and reactive to light; extraocular muscles intact Neck: supple Heart: regular rate and rhythm Lungs: clear to auscultation bilaterally Abdomen: soft; nondistended; nontender; bowel sounds present Extremities: No deformity; full range of motion; pulses normal Neurologic: Awake,  alert and oriented; motor function intact in all extremities and symmetric; no facial droop Skin: Warm and dry Psychiatric: Normal mood and affect   RESULTS  Summary of this visit's results, reviewed by myself:   EKG Interpretation  Date/Time:    Ventricular Rate:    PR Interval:    QRS Duration:   QT Interval:    QTC Calculation:   R Axis:     Text Interpretation:        Laboratory Studies: No results found for this or any previous visit (from the past 24 hour(s)). Imaging  Studies: No results found.  ED COURSE and MDM  Nursing notes and initial vitals signs, including pulse oximetry, reviewed.  Vitals:   08/15/18 0127 08/15/18 0151  BP: (!) 165/105 (!) 173/95  Pulse: 76   Resp: 16   Temp: 98.7 F (37.1 C)   TempSrc: Oral   SpO2: 100%    2:12 AM On further discussion with the patient she is only taken about one third of 1 tablet of propranolol since it was prescribed yesterday.  We will supply her with a pill cutter so she can more accurately divide her tablets.  She was advised that blood pressure medication must be taken regularly and for several doses before it is noted to be effective.  She was advised not to become overly worried about her blood pressure as this can actually cause her blood pressure to rise.  PROCEDURES    ED DIAGNOSES     ICD-10-CM   1. Hypertension not at goal [facility], Jonny Ruiz, MD 08/15/18 [id number]

## 2018-08-20 ENCOUNTER — Emergency Department (HOSPITAL_BASED_OUTPATIENT_CLINIC_OR_DEPARTMENT_OTHER)
Admission: EM | Admit: 2018-08-20 | Discharge: 2018-08-20 | Disposition: A | Discharge status: Home / Self Care | Payer: Medicaid Other | Attending: Emergency Medicine | Admitting: Emergency Medicine

## 2018-08-20 ENCOUNTER — Other Ambulatory Visit: Payer: Self-pay

## 2018-08-20 ENCOUNTER — Encounter (HOSPITAL_BASED_OUTPATIENT_CLINIC_OR_DEPARTMENT_OTHER): Payer: Self-pay | Admitting: Emergency Medicine

## 2018-08-20 DIAGNOSIS — T7840XA Allergy, unspecified, initial encounter: Secondary | ICD-10-CM

## 2018-08-20 DIAGNOSIS — R220 Localized swelling, mass and lump, head: Secondary | ICD-10-CM | POA: Diagnosis present

## 2018-08-20 DIAGNOSIS — Z79899 Other long term (current) drug therapy: Secondary | ICD-10-CM | POA: Insufficient documentation

## 2018-08-20 DIAGNOSIS — R0789 Other chest pain: Secondary | ICD-10-CM | POA: Diagnosis not present

## 2018-08-20 DIAGNOSIS — I10 Essential (primary) hypertension: Secondary | ICD-10-CM | POA: Insufficient documentation

## 2018-08-20 LAB — BASIC METABOLIC PANEL
Anion gap: 4 — ABNORMAL LOW (ref 5–15)
BUN: 10 mg/dL (ref 6–20)
CHLORIDE: 105 mmol/L (ref 98–111)
CO2: 26 mmol/L (ref 22–32)
Calcium: 8.6 mg/dL — ABNORMAL LOW (ref 8.9–10.3)
Creatinine, Ser: 0.74 mg/dL (ref 0.44–1.00)
GFR calc Af Amer: >60 mL/min (ref >60)
GFR calc non Af Amer: >60 mL/min (ref >60)
GLUCOSE: 90 mg/dL (ref 70–99)
POTASSIUM: 3.4 mmol/L — AB (ref 3.5–5.1)
Sodium: 135 mmol/L (ref 135–145)

## 2018-08-20 LAB — CBC WITH DIFFERENTIAL/PLATELET
Abs Immature Granulocytes: 0.02 10*3/uL (ref 0.00–0.07)
Basophils Absolute: 0 10*3/uL (ref 0.0–0.1)
Basophils Relative: 1 %
EOS ABS: 0 10*3/uL (ref 0.0–0.5)
EOS PCT: 1 %
HEMATOCRIT: 37.9 % (ref 36.0–46.0)
HEMOGLOBIN: 11.9 g/dL — AB (ref 12.0–15.0)
Immature Granulocytes: 0 %
LYMPHS ABS: 1.5 10*3/uL (ref 0.7–4.0)
Lymphocytes Relative: 20 %
MCH: 27.8 pg (ref 26.0–34.0)
MCHC: 31.4 g/dL (ref 30.0–36.0)
MCV: 88.6 fL (ref 80.0–100.0)
MONO ABS: 0.3 10*3/uL (ref 0.1–1.0)
Monocytes Relative: 4 %
Neutro Abs: 5.5 10*3/uL (ref 1.7–7.7)
Neutrophils Relative %: 74 %
Platelets: 333 10*3/uL (ref 150–400)
RBC: 4.28 MIL/uL (ref 3.87–5.11)
RDW: 14.2 % (ref 11.5–15.5)
WBC: 7.4 10*3/uL (ref 4.0–10.5)
nRBC: 0 % (ref 0.0–0.2)

## 2018-08-20 LAB — TROPONIN I: Troponin I: <0.03 ng/mL (ref <0.03)

## 2018-08-20 MED ORDER — POTASSIUM CHLORIDE CRYS ER 20 MEQ PO TBCR
40.0000 meq | EXTENDED_RELEASE_TABLET | Freq: Once | ORAL | Status: AC
  Administered 2018-08-20: 40 meq via ORAL
  Filled 2018-08-20: qty 2

## 2018-08-20 NOTE — ED Provider Notes (Signed)
[facility] EMERGENCY DEPARTMENT Provider Note   CSN: [id number] Arrival date & time: 08/20/18  8937     History   Chief Complaint Chief Complaint  Patient presents with  . Oral Swelling    HPI Latasha Mills is a 38 y.o. female with a past medical history significant for anemia and uncontrolled HTN who presents today complaining of upper lip swelling since this morning. Patient also complains of mild chest discomfort with no radiation, numbness, tingling. She denies any diaphoresis, nausea , vomiting or shortness of breath. Patient was recently started propanolol on 1/14 for uncontrolled HTN by her PCP. She was seen in the ED on 1/15 for concern for chest pain. Work up in the ED on 1/15 was negative. Patient currently denies any chest pain, dizziness, headache.  HPI  Past Medical History:  Diagnosis Date  . Anemia   . Herpes genitalia   . Hypertension   . Reflux     There are no active problems to display for this patient.   Past Surgical History:  Procedure Laterality Date  . CESAREAN SECTION       OB History   No obstetric history on file.      Home Medications    Prior to Admission medications   Medication Sig Start Date End Date Taking? Authorizing Provider  ALPRAZolam Prudy Feeler) 0.5 MG tablet for sedation before MRI scan; take 1 hour before scan; may repeat 15 min before scan 05/25/18   Penumalli, Glenford Bayley, MD  cetirizine (ZYRTEC) 10 MG tablet Frequency:   Dosage:0   MG  Instructions:  Note:1 tab  by mouth every evening for 10 days, the once tab in the evening as needed for allergies 11/15/13   [provider]  fluticasone (FLONASE) 50 MCG/ACT nasal spray Place into the nose. 12/12/17   [provider]  JUNEL FE 1/20 1-20 MG-MCG tablet TK 1 T PO D 04/16/18   [provider]  omeprazole (PRILOSEC) 40 MG capsule Take 1 capsule (40 mg total) by mouth daily. 05/01/18   Ward, Layla Maw, DO  propranolol (INDERAL) 40 MG tablet Take 1  tablet (40 mg total) by mouth 2 (two) times daily. 04/24/18   Penumalli, Glenford Bayley, MD  ranitidine (ZANTAC) 150 MG tablet TK 1 T PO BID PRF HEARTBURN 04/13/18   [provider]  rizatriptan (MAXALT-MLT) 10 MG disintegrating tablet Take 1 tablet (10 mg total) by mouth as needed for migraine. May repeat in 2 hours if needed 04/24/18   Penumalli, Glenford Bayley, MD  triamcinolone cream (KENALOG) 0.1 % Apply topically. 01/25/18   [provider]    Family History History reviewed. No pertinent family history.  Social History Social History   Tobacco Use  . Smoking status: Never Smoker  . Smokeless tobacco: Never Used  Substance Use Topics  . Alcohol use: Yes    Comment: occasional  . Drug use: No     Allergies   Lisinopril; Amlodipine; and Other   Review of Systems Review of Systems  HENT: Positive for facial swelling.        Upper lip swelling  Eyes: Negative.   Respiratory: Negative.   Cardiovascular: Positive for chest pain.  Gastrointestinal: Negative.   Endocrine: Negative.   Genitourinary: Negative.     Physical Exam Updated Vital Signs BP (!) 160/80 (BP Location: Right Arm)   Pulse 82   Temp 97.7 F (36.5 C) (Oral)   Resp 18   Ht 5\' 2"  (1.575 m)  Wt 73 kg   LMP 08/14/2018   SpO2 100%   BMI 29.44 kg/m   Physical Exam Constitutional:      Appearance: Normal appearance. She is normal weight.  HENT:     Head: Normocephalic and atraumatic.     Nose: Nose normal.     Mouth/Throat:     Lips: Pink.     Mouth: Mucous membranes are moist.     Pharynx: Oropharynx is clear.     Comments: Mild upper lip swelling Eyes:     Extraocular Movements: Extraocular movements intact.     Pupils: Pupils are equal, round, and reactive to light.  Neck:     Musculoskeletal: Normal range of motion.  Cardiovascular:     Rate and Rhythm: Normal rate and regular rhythm.     Pulses: Normal pulses.     Heart sounds: Normal heart sounds.  Pulmonary:     Effort:  Pulmonary effort is normal.     Breath sounds: Normal breath sounds.  Abdominal:     General: Abdomen is flat. Bowel sounds are normal.  Musculoskeletal: Normal range of motion.  Skin:    General: Skin is warm and dry.     Capillary Refill: Capillary refill takes less than 2 seconds.  Neurological:     General: No focal deficit present.     Mental Status: She is alert and oriented to person, place, and time.  Psychiatric:        Mood and Affect: Mood normal.     ED Treatments / Results  Labs (all labs ordered are listed, but only abnormal results are displayed) Labs Reviewed  BASIC METABOLIC PANEL - Abnormal; Notable for the following components:      Result Value   Potassium 3.4 (*)    Calcium 8.6 (*)    Anion gap 4 (*)    All other components within normal limits  CBC WITH DIFFERENTIAL/PLATELET - Abnormal; Notable for the following components:   Hemoglobin 11.9 (*)    All other components within normal limits  TROPONIN I    EKG EKG Interpretation  Date/Time:  Monday August 20 2018 11:51:17 EST Ventricular Rate:  65 PR Interval:    QRS Duration: 81 QT Interval:  418 QTC Calculation: 435 R Axis:   55 Text Interpretation:  Sinus rhythm Consider left ventricular hypertrophy Confirmed by Tilden Fossa [phone]) on 08/20/2018 11:56:07 AM   Radiology No results found.  Procedures Procedures (including critical care time)  Medications Ordered in ED Medications - No data to display   Initial Impression / Assessment and Plan / ED Course  I have reviewed the triage vital signs and the nursing notes.  Pertinent labs & imaging results that were available during my care of the patient were reviewed by me and considered in my medical decision making (see chart for details).   Patient is 38 yo female with history of uncontrolled HTN recently started on propanolol who presents today for upper lip swelling and chest pain. Lip swelling has improved since this morning and is  not associated with difficulty breathing or other symptoms concerning for angioedema. Patient has had allergic reaction to amlodipine and lisinopril in the past. Chest pain is not associated with any diaphoresis, numbness tingling. Recent ED visit for similar symptoms with negative workup. EKG shows NSR with sign of possible LVH likely secondary to uncontrolled HTN. Troponin was negative. BP today is elevated at 160/86. K+ was 3.4 and she received Kdur 40 mEq once. Lip swelling likely  allergic reaction to propanolol. Discuss discontinuing medication and follow up with PCP for new HTN agent. Would also considering renal artery stenosis as part of her HTN work up.   Final Clinical Impressions(s) / ED Diagnoses   Final diagnoses:  None    ED Discharge Orders    None       Lovena Neighboursiallo, Sahasra Belue, MD 08/20/18 1328    Tilden Fossaees, Elizabeth, MD 08/20/18 1524

## 2018-08-20 NOTE — Discharge Instructions (Addendum)
Your cardiac workup today negative. Lip swelling likely secondary to new medication. You can take benadryl if you continue to experience lip swelling. Will recommend stopping the medication and follow up with your PCP to start a new agent for your BP (consider HCTZ). Would also discuss possible renal artery stenosis workup.

## 2018-08-20 NOTE — ED Triage Notes (Addendum)
Reports upper lip swelling that began upon waking up this morning.  Maintaining secretions without difficulty.  Denies any recent new foods, detergents.

## 2018-10-25 ENCOUNTER — Telehealth: Payer: Self-pay | Admitting: *Deleted

## 2018-10-25 NOTE — Telephone Encounter (Signed)
Called patient and advised her that due to current COVID 19 pandemic, our office is severely reducing in person visits in order to minimize the risk to our patients and healthcare providers. We recommend to convert your appointment to a video visit. We'll take all precautions to reduce any security or privacy concerns. This will be treated like an office visit, and we will file with your insurance. There may be a patient responsible charge related to this service. She stated she has Iphone and consented to do video visit. I explained process to her, reviewed med /surg hx, meds, allergies, etc. Her email is: [email or web address]  She  verbalized understanding, appreciation.

## 2018-10-29 ENCOUNTER — Other Ambulatory Visit: Payer: Self-pay

## 2018-10-29 ENCOUNTER — Ambulatory Visit (INDEPENDENT_AMBULATORY_CARE_PROVIDER_SITE_OTHER): Payer: Medicaid Other | Admitting: Diagnostic Neuroimaging

## 2018-10-29 ENCOUNTER — Encounter: Payer: Self-pay | Admitting: Diagnostic Neuroimaging

## 2018-10-29 VITALS — BP 135/86

## 2018-10-29 DIAGNOSIS — G43109 Migraine with aura, not intractable, without status migrainosus: Secondary | ICD-10-CM | POA: Diagnosis not present

## 2018-10-29 NOTE — Progress Notes (Signed)
[facility]  PATIENT: Latasha Mills DOB: [birth year]  REFERRING CLINICIAN: Alric Ran, MD HISTORY FROM: patient  REASON FOR VISIT: follow up (VIDEO VISIT)   HISTORICAL  CHIEF COMPLAINT:  Chief Complaint  Patient presents with  . Migraine    HISTORY OF PRESENT ILLNESS:   UPDATE (10/29/18, VRP): Since last visit, doing well. Symptoms are improved. Severity is reduced. No alleviating or aggravating factors. Tolerating meds. Now improved diet and BP. Stopped eating pork.   PRIOR HPI (04/24/18): 38 year old female for evaluation of headaches.  For past 1 year patient has had intermittent bitemporal, throbbing, severe headaches associated with seeing sparkles and blurred vision.  No nausea or vomiting.  No photophobia or phonophobia.  Headaches can last hours at a time.  Now having headaches 5 to 7 days out of week.  There is family history of headaches in her mother, related hypertension.  Patient has had labile blood pressure in the past, was prescribed lisinopril, but had to stop due to angioedema reaction.  No other specific triggering factors.  No similar headaches earlier in life.    REVIEW OF SYSTEMS: Full 14 system review of systems performed and negative with exception of: as per HPI.    ALLERGIES: Allergies  Allergen Reactions  . Lisinopril     Potential angioedema  . Amlodipine     Potential facial swelling  . Other     "Some color dyes"     HOME MEDICATIONS: Outpatient Medications Prior to Visit  Medication Sig Dispense Refill  . cetirizine (ZYRTEC) 10 MG tablet Frequency:   Dosage:0   MG  Instructions:  Note:1 tab  by mouth every evening for 10 days, the once tab in the evening as needed for allergies    . fluticasone (FLONASE) 50 MCG/ACT nasal spray Place into the nose.    . JUNEL FE 1/20 1-20 MG-MCG tablet TK 1 T PO D  13  . omeprazole (PRILOSEC) 40 MG capsule Take 1 capsule (40 mg total) by mouth daily. (Patient not taking:  Reported on 10/25/2018) 30 capsule 1  . propranolol (INDERAL) 40 MG tablet Take 1 tablet (40 mg total) by mouth 2 (two) times daily. (Patient not taking: Reported on 10/25/2018) 60 tablet 6  . triamcinolone cream (KENALOG) 0.1 % Apply topically.    . Vitamin D, Ergocalciferol, (DRISDOL) 1.25 MG (50000 UT) CAPS capsule TK 1 C PO ONCE A WEEK     No facility-administered medications prior to visit.     PAST MEDICAL HISTORY: Past Medical History:  Diagnosis Date  . Anemia   . Herpes genitalia   . Hypertension   . Reflux     PAST SURGICAL HISTORY: Past Surgical History:  Procedure Laterality Date  . CESAREAN SECTION      FAMILY HISTORY: No family history on file.  SOCIAL HISTORY: Social History   Socioeconomic History  . Marital status: Single    Spouse name: Not on file  . Number of children: Not on file  . Years of education: Not on file  . Highest education level: Not on file  Occupational History  . Not on file  Social Needs  . Financial resource strain: Not on file  . Food insecurity:    Worry: Not on file    Inability: Not on file  . Transportation needs:    Medical: Not on file    Non-medical: Not on file  Tobacco Use  . Smoking status: Never Smoker  . Smokeless tobacco: Never Used  Substance  and Sexual Activity  . Alcohol use: Yes    Comment: occasional  . Drug use: No  . Sexual activity: Yes    Birth control/protection: Surgical  Lifestyle  . Physical activity:    Days per week: Not on file    Minutes per session: Not on file  . Stress: Not on file  Relationships  . Social connections:    Talks on phone: Not on file    Gets together: Not on file    Attends religious service: Not on file    Active member of club or organization: Not on file    Attends meetings of clubs or organizations: Not on file    Relationship status: Not on file  . Intimate partner violence:    Fear of current or ex partner: Not on file    Emotionally abused: Not on file     Physically abused: Not on file    Forced sexual activity: Not on file  Other Topics Concern  . Not on file  Social History Narrative   Pt is single,lives home with 5 children.    Education some college.  Caffeine soda.  Works for [employer].       PHYSICAL EXAM  GENERAL EXAM/CONSTITUTIONAL: Vitals:  There were no vitals filed for this visit. There is no height or weight on file to calculate BMI. Wt Readings from Last 3 Encounters:  08/20/18 160 lb 15 oz (73 kg)  08/14/18 161 lb (73 kg)  07/11/18 158 lb (71.7 kg)    Patient is in no distress; well developed, nourished and groomed; neck is supple  CARDIOVASCULAR:  Examination of carotid arteries is normal; no carotid bruits  Regular rate and rhythm, no murmurs  Examination of peripheral vascular system by observation and palpation is normal  EYES:  Ophthalmoscopic exam of optic discs and posterior segments is normal; no papilledema or hemorrhages No exam data present  MUSCULOSKELETAL:  Gait, strength, tone, movements noted in Neurologic exam below  NEUROLOGIC: MENTAL STATUS:  No flowsheet data found.  awake, alert, oriented to person, place and time  recent and remote memory intact  normal attention and concentration  language fluent, comprehension intact, naming intact  fund of knowledge appropriate  CRANIAL NERVE:   2nd - no papilledema on fundoscopic exam  2nd, 3rd, 4th, 6th - pupils equal and reactive to light, visual fields full to confrontation, extraocular muscles intact, no nystagmus  5th - facial sensation symmetric  7th - facial strength symmetric  8th - hearing intact  9th - palate elevates symmetrically, uvula midline  11th - shoulder shrug symmetric  12th - tongue protrusion midline  MOTOR:   normal bulk and tone, full strength in the BUE, BLE  SENSORY:   normal and symmetric to light touch, temperature, vibration  COORDINATION:   finger-nose-finger, fine finger  movements normal  REFLEXES:   deep tendon reflexes TRACE and symmetric  GAIT/STATION:   narrow based gait     DIAGNOSTIC DATA (LABS, IMAGING, TESTING) - I reviewed patient records, labs, notes, testing and imaging myself where available.  Lab Results  Component Value Date   WBC 7.4 08/20/2018   HGB 11.9 (L) 08/20/2018   HCT 37.9 08/20/2018   MCV 88.6 08/20/2018   PLT 333 08/20/2018      Component Value Date/Time   NA 135 08/20/2018 1203   K 3.4 (L) 08/20/2018 1203   CL 105 08/20/2018 1203   CO2 26 08/20/2018 1203   GLUCOSE 90 08/20/2018 1203  BUN 10 08/20/2018 1203   CREATININE 0.74 08/20/2018 1203   CALCIUM 8.6 (L) 08/20/2018 1203   PROT 8.2 (H) 04/01/2017 1130   ALBUMIN 4.3 04/01/2017 1130   AST 23 04/01/2017 1130   ALT 17 04/01/2017 1130   ALKPHOS 55 04/01/2017 1130   BILITOT 0.4 04/01/2017 1130   GFRNONAA >60 08/20/2018 1203   GFRAA >60 08/20/2018 1203   No results found for: CHOL, HDL, LDLCALC, LDLDIRECT, TRIG, CHOLHDL No results found for: ZCHY8F No results found for: VITAMINB12 No results found for: TSH   07/02/15 CT head  *No acute intracranial abnormalities. *The appearance of the brain is normal. *Minimal soft tissue swelling anterior to the left maxilla.  05/30/18 MRI brain - Few scattered punctate bifrontal subcortical foci of nonspecific gliosis. - No acute findings.    ASSESSMENT AND PLAN  38 y.o. year old female here with new onset headaches in the past 1 year, with some migraine features, with severe symptoms and increasing frequency.  Now improved with better BP, diet and allergy control.    Meds tried: propranolol, sumatriptan (not effective)  Dx:  1. Migraine with aura and without status migrainosus, not intractable      Virtual Visit via Video Note  I connected with Latasha Mills on 10/29/18 at  2:00 PM EDT by a video enabled telemedicine application and verified that I am speaking with the correct person using two  identifiers.   I discussed the limitations of evaluation and management by telemedicine and the availability of in person appointments. The patient expressed understanding and agreed to proceed.   I discussed the assessment and treatment plan with the patient. The patient was provided an opportunity to ask questions and all were answered. The patient agreed with the plan and demonstrated an understanding of the instructions.   The patient was advised to call back or seek an in-person evaluation if the symptoms worsen or if the condition fails to improve as anticipated.  I provided 15 minutes of non-face-to-face time during this encounter.  PLAN:  MIGRAINE WITH AURA - monitor for now; HA are improved with better BP, allergy and diet control  Return for pending if symptoms worsen or fail to improve, return to PCP.    Suanne Marker, MD 10/29/2018, 2:08 PM Certified in Neurology, Neurophysiology and Neuroimaging  [facility] [address], [address] [place], [place] [address] [phone]
